# Patient Record
Sex: Female | Born: 2000
Health system: Southern US, Community
[De-identification: ages and names within clinical notes are randomized; demographics above are authoritative.]

## PROBLEM LIST (undated history)

## (undated) ENCOUNTER — Inpatient Hospital Stay (HOSPITAL_COMMUNITY): Payer: Commercial Managed Care - PPO

## (undated) DIAGNOSIS — F32A Depression, unspecified: Secondary | ICD-10-CM

## (undated) DIAGNOSIS — I7300 Raynaud's syndrome without gangrene: Secondary | ICD-10-CM

## (undated) DIAGNOSIS — Z87448 Personal history of other diseases of urinary system: Secondary | ICD-10-CM

## (undated) DIAGNOSIS — G43109 Migraine with aura, not intractable, without status migrainosus: Secondary | ICD-10-CM

## (undated) DIAGNOSIS — F323 Major depressive disorder, single episode, severe with psychotic features: Secondary | ICD-10-CM

## (undated) DIAGNOSIS — S83002S Unspecified subluxation of left patella, sequela: Secondary | ICD-10-CM

## (undated) DIAGNOSIS — K121 Other forms of stomatitis: Secondary | ICD-10-CM

## (undated) DIAGNOSIS — M7918 Myalgia, other site: Secondary | ICD-10-CM

## (undated) DIAGNOSIS — F329 Major depressive disorder, single episode, unspecified: Secondary | ICD-10-CM

## (undated) DIAGNOSIS — F840 Autistic disorder: Secondary | ICD-10-CM

## (undated) DIAGNOSIS — M352 Behcet's disease: Secondary | ICD-10-CM

## (undated) DIAGNOSIS — M2550 Pain in unspecified joint: Secondary | ICD-10-CM

## (undated) DIAGNOSIS — F3181 Bipolar II disorder: Secondary | ICD-10-CM

## (undated) DIAGNOSIS — D649 Anemia, unspecified: Secondary | ICD-10-CM

## (undated) DIAGNOSIS — N946 Dysmenorrhea, unspecified: Secondary | ICD-10-CM

## (undated) DIAGNOSIS — M009 Pyogenic arthritis, unspecified: Secondary | ICD-10-CM

## (undated) DIAGNOSIS — Z8639 Personal history of other endocrine, nutritional and metabolic disease: Secondary | ICD-10-CM

## (undated) DIAGNOSIS — G894 Chronic pain syndrome: Secondary | ICD-10-CM

## (undated) DIAGNOSIS — F419 Anxiety disorder, unspecified: Secondary | ICD-10-CM

## (undated) DIAGNOSIS — J45909 Unspecified asthma, uncomplicated: Secondary | ICD-10-CM

## (undated) DIAGNOSIS — L309 Dermatitis, unspecified: Secondary | ICD-10-CM

## (undated) HISTORY — DX: Autistic disorder: F84.0

## (undated) HISTORY — DX: Dysmenorrhea, unspecified: N94.6

## (undated) HISTORY — DX: Migraine with aura, not intractable, without status migrainosus: G43.109

## (undated) HISTORY — DX: Major depressive disorder, single episode, unspecified: F32.9

## (undated) HISTORY — DX: Depression, unspecified: F32.A

## (undated) HISTORY — DX: Unspecified asthma, uncomplicated: J45.909

## (undated) HISTORY — DX: Personal history of other endocrine, nutritional and metabolic disease: Z86.39

## (undated) HISTORY — DX: Raynaud's syndrome without gangrene: I73.00

## (undated) HISTORY — DX: Dermatitis, unspecified: L30.9

## (undated) HISTORY — DX: Unspecified subluxation of left patella, sequela: S83.002S

## (undated) HISTORY — DX: Anemia, unspecified: D64.9

## (undated) HISTORY — DX: Myalgia, other site: M79.18

## (undated) HISTORY — DX: Pain in unspecified joint: M25.50

## (undated) HISTORY — DX: Bipolar II disorder: F31.81

## (undated) HISTORY — DX: Anxiety disorder, unspecified: F41.9

## (undated) HISTORY — DX: Behcet's disease: M35.2

## (undated) HISTORY — DX: Personal history of other diseases of urinary system: Z87.448

## (undated) HISTORY — DX: Other forms of stomatitis: K12.1

## (undated) HISTORY — DX: Pyogenic arthritis, unspecified: M00.9

## (undated) HISTORY — DX: Major depressive disorder, single episode, severe with psychotic features: F32.3

## (undated) HISTORY — DX: Chronic pain syndrome: G89.4

---
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
## 2020-01-10 ENCOUNTER — Encounter: Payer: Self-pay | Admitting: Physician Assistant

## 2020-01-10 ENCOUNTER — Other Ambulatory Visit: Payer: Self-pay | Admitting: Physician Assistant

## 2020-01-10 DIAGNOSIS — F411 Generalized anxiety disorder: Secondary | ICD-10-CM

## 2020-01-10 MED ORDER — HYDROXYZINE HCL 10 MG PO TABS: ORAL_TABLET | ORAL | 0 refills | Status: DC

## 2020-01-14 ENCOUNTER — Other Ambulatory Visit: Payer: Self-pay | Admitting: Obstetrics and Gynecology

## 2020-01-14 DIAGNOSIS — R102 Pelvic and perineal pain: Secondary | ICD-10-CM

## 2020-01-14 NOTE — Telephone Encounter (Signed)
Medication refill request: Ibuprofen 800mg   Last OV:  08/30/19  Next AEX: not scheduled  Last MMG (if hormonal medication request): NA Refill authorized: 30/0

## 2020-01-16 ENCOUNTER — Telehealth (INDEPENDENT_AMBULATORY_CARE_PROVIDER_SITE_OTHER): Payer: Managed Care, Other (non HMO) | Admitting: Family Medicine

## 2020-01-16 DIAGNOSIS — H9201 Otalgia, right ear: Secondary | ICD-10-CM

## 2020-01-16 DIAGNOSIS — R0981 Nasal congestion: Secondary | ICD-10-CM

## 2020-01-16 DIAGNOSIS — R519 Headache, unspecified: Secondary | ICD-10-CM | POA: Diagnosis not present

## 2020-01-16 DIAGNOSIS — H9191 Unspecified hearing loss, right ear: Secondary | ICD-10-CM

## 2020-01-16 MED ORDER — AMOXICILLIN-POT CLAVULANATE 875-125 MG PO TABS: 1.0000 | ORAL_TABLET | Freq: Two times a day (BID) | ORAL | 0 refills | Status: DC

## 2020-01-16 NOTE — Progress Notes (Signed)
Virtual Visit via Video Note  I connected with Molly Molina  on 01/16/20 at  1:00 PM EDT by a video enabled telemedicine application and verified that I am speaking with the correct person using two identifiers.  Location patient: home, [place] Location provider:work or home office Persons participating in the virtual visit: patient, provider  I discussed the limitations of evaluation and management by telemedicine and the availability of in person appointments. The patient expressed understanding and agreed to proceed.   HPI:  Acute telemedicine visit for ear discomfort and upper respiratory symptoms: -Onset: had a resp illness with nasal cong, cough that started about two weeks ago - got a little better with allergy medications -however, worse the last few days -Symptoms include: today has pain and warm feeling in the R ear, she put a qtip in it and it was "bloddy orange" with "pus or something". she also has pain in the R side of the throat, hearing feels muffled on the R side.  She has sinus congestion and sinus discomfort. -Denies: fevers, sob, worst headache, persistent drainage from the ear -Has tried: benadryl, then zyrtec, robitussin honey -Pertinent past medical history: treated for ear infection in august, treated with abx for abscess in sept, Bechet's, hx of strep, recurrent ear in fections -Pertinent medication allergies: cephalosporins, reports tolerated amox well -COVID-19 vaccine status: Fully vaccinated   ROS: See pertinent positives and negatives per HPI.  Past Medical History:  Diagnosis Date  . Amplified musculoskeletal pain   . Anemia   . Anxiety   . Arthralgia    Fever, arthralgias,rash,abd pain, darrhiea, oral ulcers, and fatigue.  . Asthma   . Asthma   . Autism   . Behcet's syndrome (HCC) 2016   Possible  . Bipolar 2 disorder (HCC)    per patient diagnosis found out to not be bipolar  . Depression   . Dysmenorrhea in adolescent   . Eczema   . History of  proteinuria syndrome 2012   + hx of hematuria: nephrol w/u in [place] and [place] both normal.  . History of vitamin D deficiency   . Major depressive disorder, single episode, severe with psychotic features (HCC) 12/22/2015  . Migraine with aura   . Mouth ulcers    some vaginal also  . Patellar subluxation, left, sequela 08/08/2016  . Raynaud's phenomenon   . Septic arthritis of hip (HCC) 2010    Past Surgical History:  Procedure Laterality Date  . APPENDECTOMY  2010  . MRI L/S spine  2013   Possible bilateral pars defect at L5 without evidence of spondylolisthesis.  Otherwise normal.     Current Outpatient Medications:  .  amoxicillin-clavulanate (AUGMENTIN) 875-125 MG tablet, Take 1 tablet by mouth 2 (two) times daily., Disp: 20 tablet, Rfl: 0 .  colchicine 0.6 MG tablet, Take 0.6 mg by mouth 2 (two) times daily. , Disp: , Rfl:  .  doxycycline (VIBRA-TABS) 100 MG tablet, Take 1 tablet (100 mg total) by mouth 2 (two) times daily., Disp: 20 tablet, Rfl: 0 .  hydrOXYzine (ATARAX/VISTARIL) 10 MG tablet, TAKE 1 TABLET BY MOUTH UP TO THREE TIMES DAILY. MAY TAKE AN ADDITIONAL 2 TABLETS IN THE EVENING, Disp: 150 tablet, Rfl: 0 .  ibuprofen (ADVIL) 800 MG tablet, TAKE 1 TABLET(800 MG) BY MOUTH EVERY 8 HOURS AS NEEDED, Disp: 30 tablet, Rfl: 1 .  ketotifen (ZADITOR) 0.025 % ophthalmic solution, Place 1 drop into both eyes 2 (two) times daily as needed (for itchy eyes). , Disp: ,  Rfl:   EXAM:  VITALS per patient if applicable:  GENERAL: alert, oriented, appears well and in no acute distress  HEENT: atraumatic, conjunttiva clear, no obvious abnormalities on inspection of external nose and ears  NECK: normal movements of the head and neck  LUNGS: on inspection no signs of respiratory distress, breathing rate appears normal, no obvious gross SOB, gasping or wheezing  CV: no obvious cyanosis  MS: moves all visible extremities without noticeable abnormality  PSYCH/NEURO: pleasant and  cooperative, no obvious depression or anxiety, speech and thought processing grossly intact  ASSESSMENT AND PLAN:  Discussed the following assessment and plan:  Discomfort of right ear  Nasal sinus congestion  Facial discomfort  Hearing loss of right ear, unspecified hearing loss type  -we discussed possible serious and likely etiologies, options for evaluation and workup, limitations of telemedicine visit vs in person visit, treatment, treatment risks and precautions. Pt prefers to treat via telemedicine empirically rather than in person at this moment.  However, given that she saw the blood and pus on the Q-tip and the recurrent ear issues, I did recommend an evaluation in person.  Things are complicated, due to the fact that she is leaving for [place] in 2 days.  She opted to start empiric treatment with Augmentin 875 twice daily for 10 days, but she also agrees to be seen in person either with an ear nose and throat specialist or at urgent care prior to travel.  She plans to call around to several ear nose and throat offices after this visit, to see who can get her in faster.  It would be good for her to be plugged in with an ear nose and throat specialist at some point, given she reports recurrent ear infections and throat infections as an adult.  If she cannot be seen by ear nose and throat, she plans to seek care at a local urgent care near where she lives in the next 1 to 2 days.  Scheduled follow up with PCP offered: Declined    Did let this patient know that I only do telemedicine on Tuesdays and Thursdays for [place]. Advised to schedule follow up visit with PCP or UCC if any further questions or concerns to avoid delays in care.   I discussed the assessment and treatment plan with the patient. The patient was provided an opportunity to ask questions and all were answered. The patient agreed with the plan and demonstrated an understanding of the instructions.     Terressa Koyanagi, DO

## 2020-01-17 ENCOUNTER — Ambulatory Visit (INDEPENDENT_AMBULATORY_CARE_PROVIDER_SITE_OTHER): Payer: Managed Care, Other (non HMO) | Admitting: Family Medicine

## 2020-01-17 ENCOUNTER — Other Ambulatory Visit: Payer: Self-pay

## 2020-01-17 VITALS — BP 102/66 | HR 78 | Temp 98.2°F | Ht 62.75 in | Wt 207.0 lb

## 2020-01-17 DIAGNOSIS — M352 Behcet's disease: Secondary | ICD-10-CM

## 2020-01-17 DIAGNOSIS — H6690 Otitis media, unspecified, unspecified ear: Secondary | ICD-10-CM

## 2020-01-17 MED ORDER — CIPROFLOXACIN-DEXAMETHASONE 0.3-0.1 % OT SUSP: 4.0000 [drp] | Freq: Two times a day (BID) | OTIC | 0 refills | Status: DC

## 2020-01-17 NOTE — Assessment & Plan Note (Signed)
Will check CBC and CMET today per rheumatology.

## 2020-01-17 NOTE — Progress Notes (Signed)
   Molly Molina is a 19 y.o. female who presents today for an office visit.  Assessment/Plan:  New/Acute Problems: Right Ear Pain Slight otitis externa.  Does have a middle ear effusion but no definitive signs of otitis media.  We will start Ciprodex drops.  Advised her to hold onto Augmentin prescription and start if symptoms worsen or do not improve with Ciprodex drops over the next few days.  Given her recurrent pharyngitis and ear infections will place referral to ENT.  Chronic Problems Addressed Today: Behcet's syndrome (HCC) Will check CBC and CMET today per rheumatology.      Subjective:  HPI:  Patient had virtual visit by another provider yesterday for right ear pain.  She was prescribed Augmentin but has not yet started.  Symptoms started 4 days ago. Worsening the last few days. She used a qtip and found a blood discharge. Also some pain to the right side of her neck.   This is her third ear infection this year. She also has a history of recurrent strep pharyngitis.        Objective:  Physical Exam: BP 102/66   Pulse 78   Temp 98.2 F (36.8 C) (Temporal)   Ht 5' 2.75" (1.594 m)   Wt 207 lb (93.9 kg)   LMP 01/09/2020   SpO2 99%   BMI 36.96 kg/m   Gen: No acute distress, resting comfortably HEENT: LEft TM Clear.  Right TM with erythema and slight bulge.  Right EAC with erythema and inflammation. CV: Regular rate and rhythm with no murmurs appreciated Pulm: Normal work of breathing, clear to auscultation bilaterally with no crackles, wheezes, or rhonchi Neuro: Grossly normal, moves all extremities Psych: Normal affect and thought content  Time Spent: 35 minutes of total time was spent on the date of the encounter performing the following actions: chart review prior to seeing the patient, obtaining history, performing a medically necessary exam, counseling on the treatment plan including starting Ciprodex drops and holding onto Augmentin, placing orders, and  documenting in our EHR.         Katina Degree. Jimmey Ralph, MD 01/17/2020 4:27 PM

## 2020-01-17 NOTE — Patient Instructions (Addendum)
It was very nice to see you today!  Please try the ear drops. Start the antibiotic if your symptoms are not improving.   I will check the blood work your rheumatologist needs today.  I will place a referral for you to see ENT.   Take care, Dr Jimmey Ralph  Please try these tips to maintain a healthy lifestyle:   Eat at least 3 REAL meals and 1-2 snacks per day.  Aim for no more than 5 hours between eating.  If you eat breakfast, please do so within one hour of getting up.    Each meal should contain half fruits/vegetables, one quarter protein, and one quarter carbs (no bigger than a computer mouse)   Cut down on sweet beverages. This includes juice, soda, and sweet tea.     Drink at least 1 glass of water with each meal and aim for at least 8 glasses per day   Exercise at least 150 minutes every week.

## 2020-01-18 LAB — CBC WITH DIFFERENTIAL/PLATELET
Absolute Monocytes: 524 cells/uL (ref 200–950)
Basophils Absolute: 68 cells/uL (ref 0–200)
Basophils Relative: 1 %
Eosinophils Absolute: 401 cells/uL (ref 15–500)
Eosinophils Relative: 5.9 %
HCT: 39.4 % (ref 35.0–45.0)
Hemoglobin: 12.9 g/dL (ref 11.7–15.5)
Lymphs Abs: 1836 cells/uL (ref 850–3900)
MCH: 26.3 pg — ABNORMAL LOW (ref 27.0–33.0)
MCHC: 32.7 g/dL (ref 32.0–36.0)
MCV: 80.4 fL (ref 80.0–100.0)
MPV: 11 fL (ref 7.5–12.5)
Monocytes Relative: 7.7 %
Neutro Abs: 3971 cells/uL (ref 1500–7800)
Neutrophils Relative %: 58.4 %
Platelets: 330 10*3/uL (ref 140–400)
RBC: 4.9 10*6/uL (ref 3.80–5.10)
RDW: 13.3 % (ref 11.0–15.0)
Total Lymphocyte: 27 %
WBC: 6.8 10*3/uL (ref 3.8–10.8)

## 2020-01-18 LAB — COMPREHENSIVE METABOLIC PANEL
AG Ratio: 1.6 (calc) (ref 1.0–2.5)
ALT: 10 U/L (ref 5–32)
AST: 13 U/L (ref 12–32)
Albumin: 4.5 g/dL (ref 3.6–5.1)
Alkaline phosphatase (APISO): 51 U/L (ref 36–128)
BUN: 14 mg/dL (ref 7–20)
CO2: 27 mmol/L (ref 20–32)
Calcium: 9.7 mg/dL (ref 8.9–10.4)
Chloride: 104 mmol/L (ref 98–110)
Creat: 0.81 mg/dL (ref 0.50–1.00)
Globulin: 2.9 g/dL (calc) (ref 2.0–3.8)
Glucose, Bld: 73 mg/dL (ref 65–99)
Potassium: 4.1 mmol/L (ref 3.8–5.1)
Sodium: 140 mmol/L (ref 135–146)
Total Bilirubin: 0.4 mg/dL (ref 0.2–1.1)
Total Protein: 7.4 g/dL (ref 6.3–8.2)

## 2020-01-20 NOTE — Progress Notes (Signed)
Please inform patient of the following:  Labs are NORMAL - she should be able to see this through mychart to send to her rheumatologist.  Molly Molina. Jimmey Ralph, MD 01/20/2020 8:12 AM

## 2020-01-22 IMAGING — CT CT CHEST W/O CM
1 of 2 series · 15 of 32 positions shown, 19 images · non-contrast
Comparison: None.

CLINICAL DATA: 16-year-old female with chest pain and shortness of
breath with activity. Kassa disease.

EXAM:
CT CHEST WITHOUT CONTRAST
TECHNIQUE: Multidetector CT imaging of the chest was performed following the
standard protocol without IV contrast.

[Series 2: chest w/(date) · axial · 0.69mm/px · z∈[-223,+7]mm · 15 of 127 slices shown, 19 images]
[im 6/127  mediastinal]
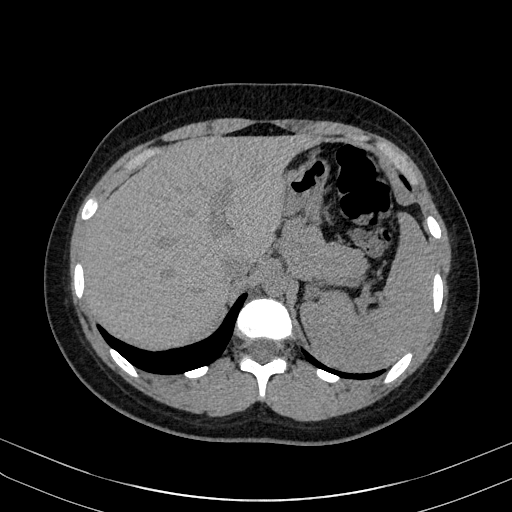
[im 6/127  lung]
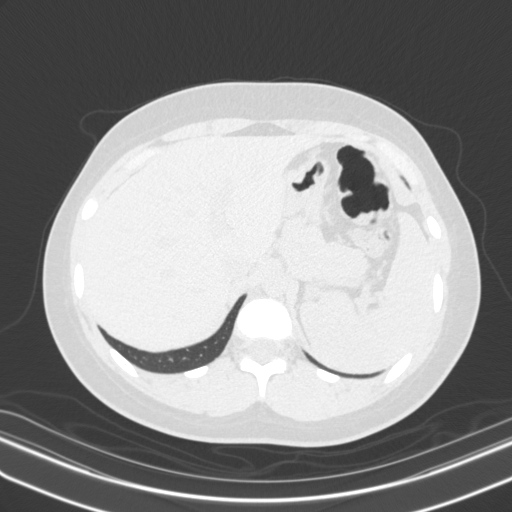
[im 17/127  lung]
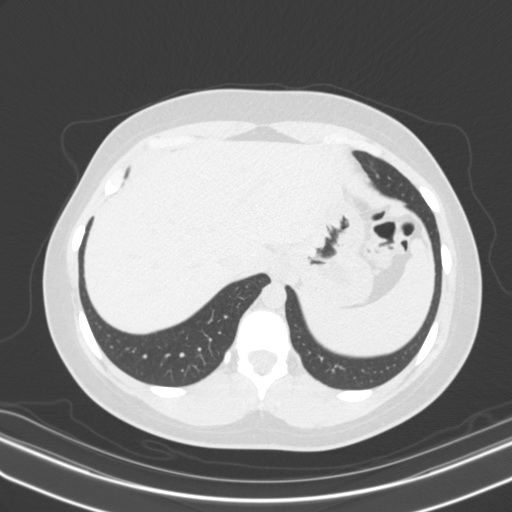
[im 28/127  lung]
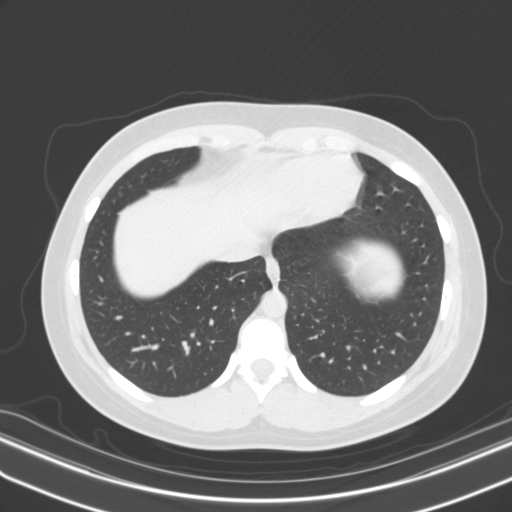
[im 33/127  lung]
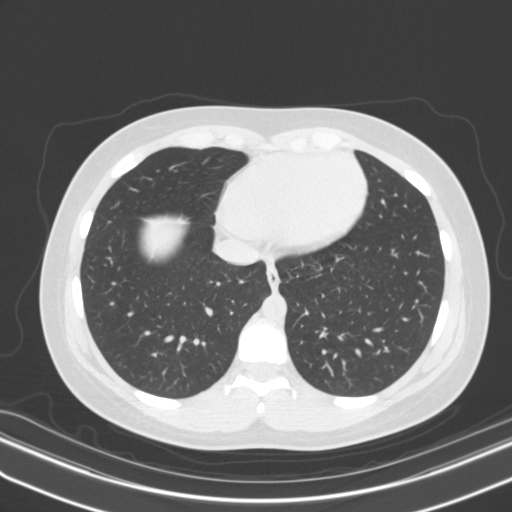
[im 39/127  mediastinal]
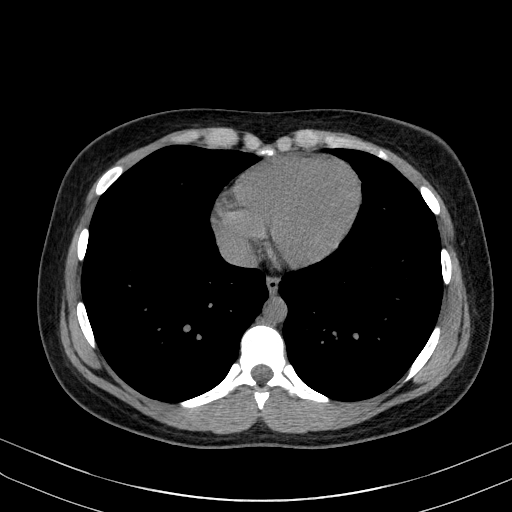
[im 39/127  lung]
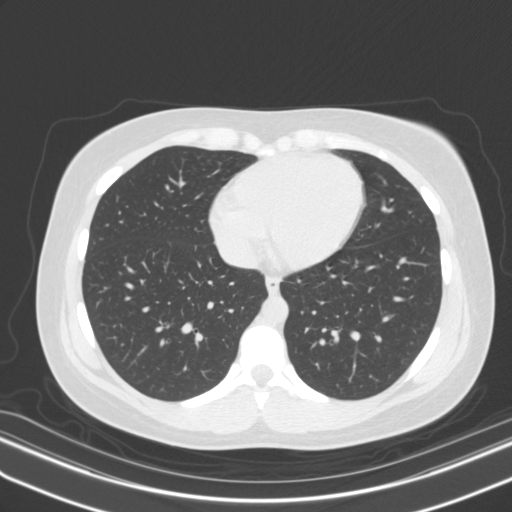
[im 50/127  lung]
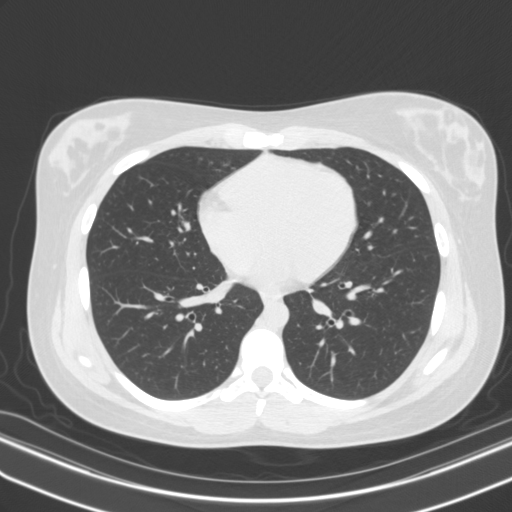
[im 55/127  lung]
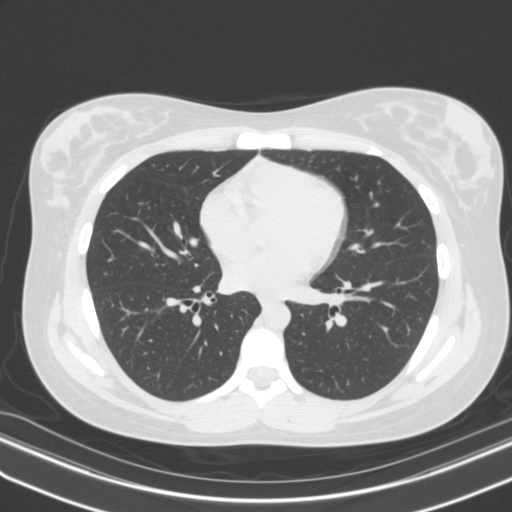
[im 64/127  lung]
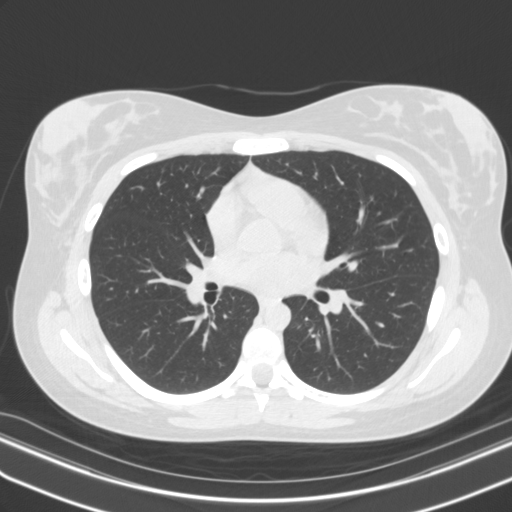
[im 66/127  mediastinal]
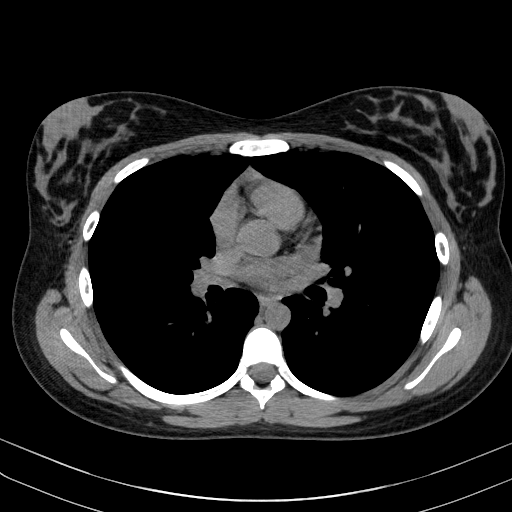
[im 66/127  lung]
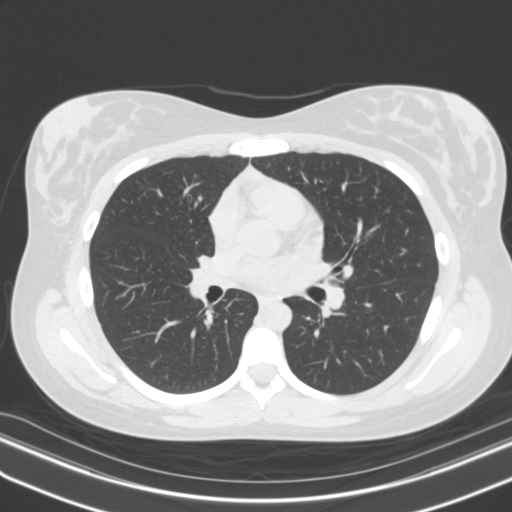
[im 77/127  lung]
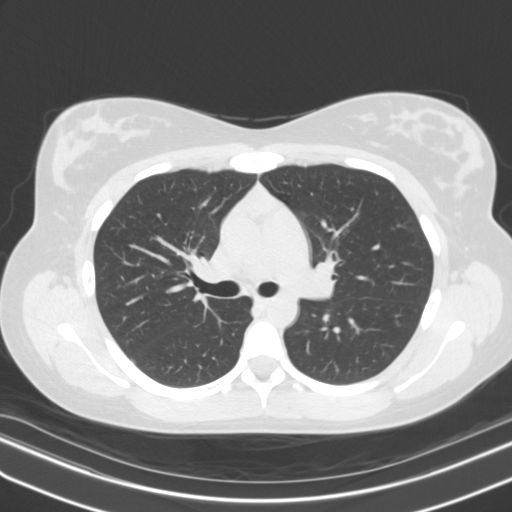
[im 88/127  lung]
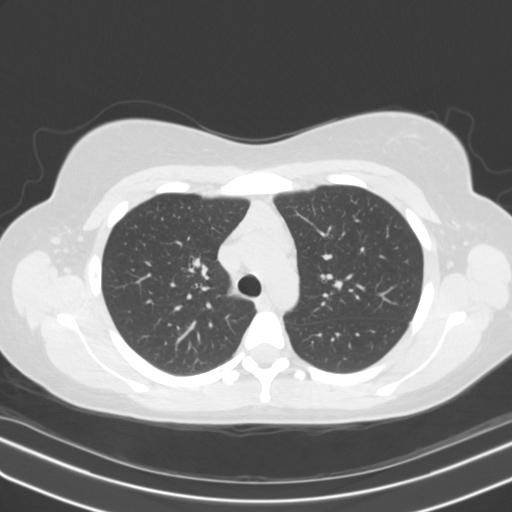
[im 94/127  lung]
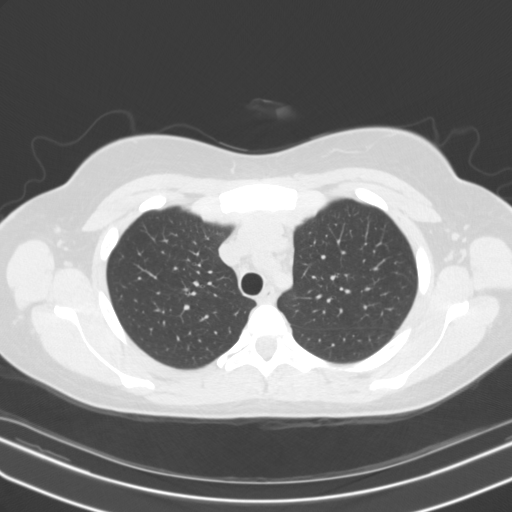
[im 99/127  mediastinal]
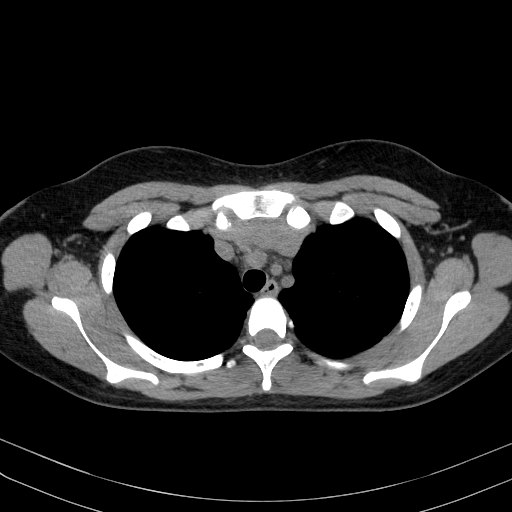
[im 99/127  lung]
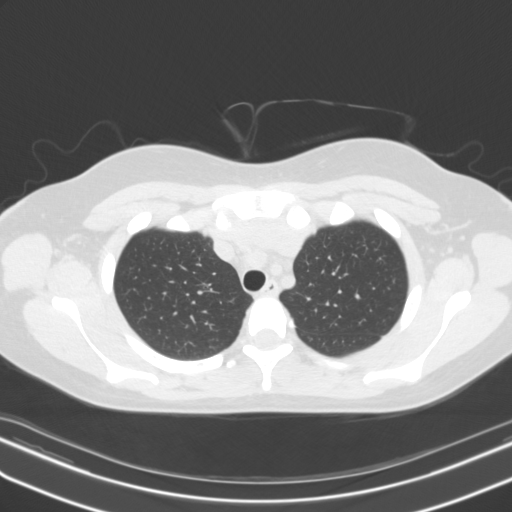
[im 110/127  lung]
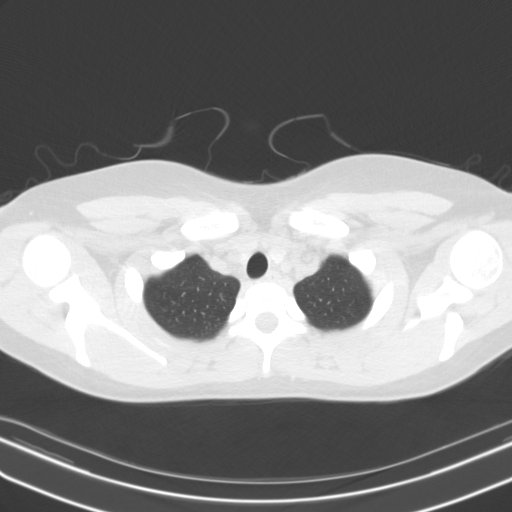
[im 121/127  lung]
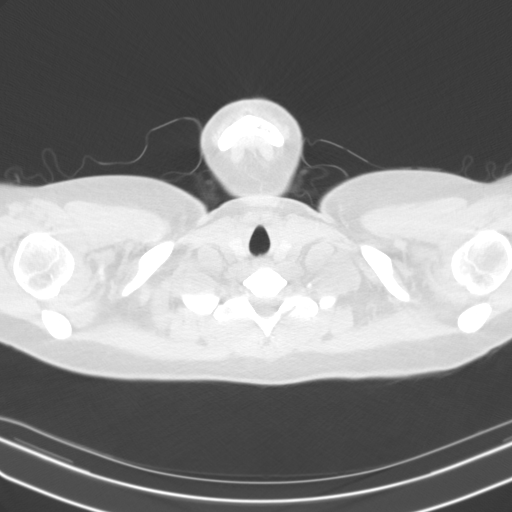

[15 of 32 positions shown; findings below may reference images not displayed]

FINDINGS: Cardiovascular: No pericardial effusion. No cardiomegaly. Vascular
patency is not evaluated in the absence of IV contrast. Grossly
normal great mediastinal vessel morphology; no aortic or pulmonary
artery enlargement.

Mediastinum/Nodes: Physiologic thymus. Normal mediastinum, no
lymphadenopathy. Negative noncontrast thoracic inlet.

Lungs/Pleura: The major airways are patent and normal. Normal lung
volumes. Both lungs are clear. No pleural effusion.

Upper Abdomen: Visible noncontrast liver, spleen, pancreas, adrenal
glands, kidneys, and bowel in the upper abdomen appear normal.

Musculoskeletal: Negative.
IMPRESSION: Normal noncontrast CT appearance of the chest. No pulmonary
abnormality.

## 2020-03-06 ENCOUNTER — Encounter: Payer: Self-pay | Admitting: Physician Assistant

## 2020-03-16 ENCOUNTER — Ambulatory Visit: Payer: Managed Care, Other (non HMO) | Admitting: Physician Assistant

## 2020-03-18 ENCOUNTER — Ambulatory Visit (INDEPENDENT_AMBULATORY_CARE_PROVIDER_SITE_OTHER): Payer: Managed Care, Other (non HMO) | Admitting: Physician Assistant

## 2020-03-18 ENCOUNTER — Other Ambulatory Visit: Payer: Self-pay

## 2020-03-18 ENCOUNTER — Encounter: Payer: Self-pay | Admitting: Physician Assistant

## 2020-03-18 VITALS — BP 100/66 | HR 86 | Temp 97.8°F | Ht 62.75 in | Wt 212.5 lb

## 2020-03-18 DIAGNOSIS — L989 Disorder of the skin and subcutaneous tissue, unspecified: Secondary | ICD-10-CM

## 2020-03-18 NOTE — Patient Instructions (Addendum)
It was great to see you!  Call the dermatologist  [facility]:   [address] [place], [place] [address] Phone: [phone]  Let me know if you have trouble getting through for an appointment.  Take care,  Jarold Motto PA-C

## 2020-03-18 NOTE — Progress Notes (Signed)
Molly Molina is a 19 y.o. female here for a follow up of a pre-existing problem.  I acted as a Neurosurgeon for [employer], PA-C Corky Mull, LPN   History of Present Illness:   Chief Complaint  Patient presents with  . Abscess    HPI   Abscess Pt c/o abscess on left buttock has been draining off and on since Nov.  Pt has been cleaning and applying bacitracin to area. She has recurrent lesions on her buttocks and wonders why this keeps occurring. Denies: fevers, chills, malaise, nausea. She is worried that these areas are not completely healing appropriately.  Past Medical History:  Diagnosis Date  . Amplified musculoskeletal pain   . Anemia   . Anxiety   . Arthralgia    Fever, arthralgias,rash,abd pain, darrhiea, oral ulcers, and fatigue.  . Asthma   . Asthma   . Autism   . Behcet's syndrome (HCC) 2016   Possible  . Bipolar 2 disorder (HCC)    per patient diagnosis found out to not be bipolar  . Depression   . Dysmenorrhea in adolescent   . Eczema   . History of proteinuria syndrome 2012   + hx of hematuria: nephrol w/u in [place] and [place] both normal.  . History of vitamin D deficiency   . Major depressive disorder, single episode, severe with psychotic features (HCC) 12/22/2015  . Migraine with aura   . Mouth ulcers    some vaginal also  . Patellar subluxation, left, sequela 08/08/2016  . Raynaud's phenomenon   . Septic arthritis of hip (HCC) 2010     Social History   Tobacco Use  . Smoking status: Never Smoker  . Smokeless tobacco: Never Used  Vaping Use  . Vaping Use: Never used  Substance Use Topics  . Alcohol use: No    Alcohol/week: 0.0 standard drinks  . Drug use: No    Past Surgical History:  Procedure Laterality Date  . APPENDECTOMY  2010  . MRI L/S spine  2013   Possible bilateral pars defect at L5 without evidence of spondylolisthesis.  Otherwise normal.    Family History  Problem Relation Age of Onset  . Cancer Mother   .  Cervical cancer Mother   . Endometriosis Mother   . Bipolar disorder Mother   . Anxiety disorder Mother   . Anxiety disorder Father   . Anxiety disorder Maternal Aunt   . Depression Maternal Aunt   . Thyroid disease Maternal Grandmother   . Bipolar disorder Maternal Grandmother   . Depression Maternal Grandmother   . Prostate cancer Maternal Grandfather   . Diabetes Maternal Grandfather   . Heart disease Maternal Grandfather   . Heart disease Paternal Grandmother   . Rheumatic fever Paternal Grandmother   . Stroke Paternal Grandmother   . Depression Paternal Grandmother   . Mental illness Paternal Grandmother        either bipolar or schizophrenia  . Suicidality Paternal Grandmother        attempted  . Anxiety disorder Cousin   . Depression Cousin   . Suicidality Cousin   . Suicidality Other   . Suicidality Other     Allergies  Allergen Reactions  . Mushroom Extract Complex Hives and Rash  . Cephalosporins Hives and Rash    Current Medications:   Current Outpatient Medications:  .  colchicine 0.6 MG tablet, Take 0.6 mg by mouth 2 (two) times daily. , Disp: , Rfl:  .  hydrOXYzine (ATARAX/VISTARIL) 10  MG tablet, TAKE 1 TABLET BY MOUTH UP TO THREE TIMES DAILY. MAY TAKE AN ADDITIONAL 2 TABLETS IN THE EVENING, Disp: 150 tablet, Rfl: 0 .  ibuprofen (ADVIL) 800 MG tablet, TAKE 1 TABLET(800 MG) BY MOUTH EVERY 8 HOURS AS NEEDED, Disp: 30 tablet, Rfl: 1 .  ketotifen (ZADITOR) 0.025 % ophthalmic solution, Place 1 drop into both eyes 2 (two) times daily as needed (for itchy eyes). , Disp: , Rfl:    Review of Systems:   ROS Negative unless otherwise specified per HPI.  Vitals:   Vitals:   03/18/20 1543  BP: 100/66  Pulse: 86  Temp: 97.8 F (36.6 C)  TempSrc: Temporal  SpO2: 100%  Weight: 212 lb 8 oz (96.4 kg)  Height: 5' 2.75" (1.594 m)     Body mass index is 37.94 kg/m.  Physical Exam:   Physical Exam Constitutional:      Appearance: She is well-developed and  well-nourished.  HENT:     Head: Normocephalic and atraumatic.  Eyes:     Extraocular Movements: EOM normal.     Conjunctiva/sclera: Conjunctivae normal.  Pulmonary:     Effort: Pulmonary effort is normal.  Musculoskeletal:        General: Normal range of motion.     Cervical back: Normal range of motion and neck supple.  Skin:    General: Skin is warm and dry.     Comments: Buttocks with significant scarring from past lesions One 1/2 cm fluctuant raised lesion without TTP on L buttock  Neurological:     Mental Status: She is alert and oriented to person, place, and time.  Psychiatric:        Mood and Affect: Mood and affect normal.        Behavior: Behavior normal.        Thought Content: Thought content normal.        Judgment: Judgment normal.       Assessment and Plan:   Molly Molina was seen today for abscess.  Diagnoses and all orders for this visit:  Skin lesions   No red flags on exam. Offered I&D of early lesion but she declined. Will have her contact her dermatologist (I provided the number) to see if they could get her scheduled so she can have further evaluation of these recurrent lesions. Worsening precautions advised.   CMA or LPN served as scribe during this visit. History, Physical, and Plan performed by medical provider. The above documentation has been reviewed and is accurate and complete.  Jarold Motto, PA-C

## 2020-03-31 ENCOUNTER — Ambulatory Visit: Payer: Managed Care, Other (non HMO) | Admitting: Physician Assistant

## 2020-04-14 ENCOUNTER — Other Ambulatory Visit: Payer: Managed Care, Other (non HMO)

## 2020-04-14 DIAGNOSIS — Z20822 Contact with and (suspected) exposure to covid-19: Secondary | ICD-10-CM

## 2020-04-16 ENCOUNTER — Ambulatory Visit: Payer: Managed Care, Other (non HMO) | Admitting: Physician Assistant

## 2020-04-18 LAB — NOVEL CORONAVIRUS, NAA: SARS-CoV-2, NAA: NOT DETECTED

## 2020-04-29 ENCOUNTER — Ambulatory Visit: Payer: Managed Care, Other (non HMO) | Admitting: Physician Assistant

## 2020-04-29 ENCOUNTER — Encounter: Payer: Self-pay | Admitting: Physician Assistant

## 2020-04-29 ENCOUNTER — Other Ambulatory Visit: Payer: Self-pay

## 2020-04-29 VITALS — BP 110/62 | HR 72 | Temp 97.7°F | Ht 62.75 in | Wt 217.4 lb

## 2020-04-29 DIAGNOSIS — L0291 Cutaneous abscess, unspecified: Secondary | ICD-10-CM

## 2020-04-29 MED ORDER — DOXYCYCLINE HYCLATE 100 MG PO TABS: 100.0000 mg | ORAL_TABLET | Freq: Two times a day (BID) | ORAL | 0 refills | Status: DC

## 2020-04-29 NOTE — Patient Instructions (Signed)
It was great to see you!  Please continue to plan to follow-up with dermatology.  Start oral doxycycline.  Abscess care 1. If you have an abscess that has not drained, apply heat to the affected area. Use the heat source that your health care provider recommends, such as a moist heat pack or a heating pad. ? Place a towel between your skin and the heat source. ? Leave the heat on for 20-30 minutes. ? Remove the heat if your skin turns bright red. This is especially important if you are unable to feel pain, heat, or cold. You may have a greater risk of getting burned. 2. Follow instructions from your health care provider about how to take care of your abscess. Make sure you: ? Cover the abscess with a bandage (dressing). ? Change your dressing or gauze as told by your health care provider. ? Wash your hands with soap and water before you change the dressing or gauze. If soap and water are not available, use hand sanitizer. 3. Check your abscess every day for signs of a worsening infection. Check for: ? More redness, swelling, or pain. ? More fluid or blood. ? Warmth. ? More pus or a bad smell.

## 2020-04-29 NOTE — Progress Notes (Signed)
Molly Molina is a 20 y.o. female here for a new problem.  I acted as a Neurosurgeon for [employer], PA-C [employer], LPN   History of Present Illness:   Chief Complaint  Patient presents with  . cysts    HPI    Early abscess Pt c/o 2 cysts on the R side of her bikini line area. She noticed one yesterday. She has applied alcohol to them and bacitracin. She denies: fevers, chills, n/v, fatigue. Area has not spontaneously opened. She has hx of recurrent skin infections and pending dermatology appointment.   Past Medical History:  Diagnosis Date  . Amplified musculoskeletal pain   . Anemia   . Anxiety   . Arthralgia    Fever, arthralgias,rash,abd pain, darrhiea, oral ulcers, and fatigue.  . Asthma   . Asthma   . Autism   . Behcet's syndrome (HCC) 2016   Possible  . Bipolar 2 disorder (HCC)    per patient diagnosis found out to not be bipolar  . Depression   . Dysmenorrhea in adolescent   . Eczema   . History of proteinuria syndrome 2012   + hx of hematuria: nephrol w/u in [place] and [place] both normal.  . History of vitamin D deficiency   . Major depressive disorder, single episode, severe with psychotic features (HCC) 12/22/2015  . Migraine with aura   . Mouth ulcers    some vaginal also  . Patellar subluxation, left, sequela 08/08/2016  . Raynaud's phenomenon   . Septic arthritis of hip (HCC) 2010     Social History   Tobacco Use  . Smoking status: Never Smoker  . Smokeless tobacco: Never Used  Vaping Use  . Vaping Use: Never used  Substance Use Topics  . Alcohol use: No    Alcohol/week: 0.0 standard drinks  . Drug use: No    Past Surgical History:  Procedure Laterality Date  . APPENDECTOMY  2010  . MRI L/S spine  2013   Possible bilateral pars defect at L5 without evidence of spondylolisthesis.  Otherwise normal.    Family History  Problem Relation Age of Onset  . Cancer Mother   . Cervical cancer Mother   . Endometriosis Mother   .  Bipolar disorder Mother   . Anxiety disorder Mother   . Anxiety disorder Father   . Anxiety disorder Maternal Aunt   . Depression Maternal Aunt   . Thyroid disease Maternal Grandmother   . Bipolar disorder Maternal Grandmother   . Depression Maternal Grandmother   . Prostate cancer Maternal Grandfather   . Diabetes Maternal Grandfather   . Heart disease Maternal Grandfather   . Heart disease Paternal Grandmother   . Rheumatic fever Paternal Grandmother   . Stroke Paternal Grandmother   . Depression Paternal Grandmother   . Mental illness Paternal Grandmother        either bipolar or schizophrenia  . Suicidality Paternal Grandmother        attempted  . Anxiety disorder Cousin   . Depression Cousin   . Suicidality Cousin   . Suicidality Other   . Suicidality Other     Allergies  Allergen Reactions  . Mushroom Extract Complex Hives and Rash  . Cephalosporins Hives and Rash    Current Medications:   Current Outpatient Medications:  .  colchicine 0.6 MG tablet, Take 0.6 mg by mouth 2 (two) times daily. , Disp: , Rfl:  .  doxycycline (VIBRA-TABS) 100 MG tablet, Take 1 tablet (100 mg  total) by mouth 2 (two) times daily., Disp: 20 tablet, Rfl: 0 .  hydrOXYzine (ATARAX/VISTARIL) 10 MG tablet, TAKE 1 TABLET BY MOUTH UP TO THREE TIMES DAILY. MAY TAKE AN ADDITIONAL 2 TABLETS IN THE EVENING, Disp: 150 tablet, Rfl: 0 .  ibuprofen (ADVIL) 800 MG tablet, TAKE 1 TABLET(800 MG) BY MOUTH EVERY 8 HOURS AS NEEDED, Disp: 30 tablet, Rfl: 1 .  ketotifen (ZADITOR) 0.025 % ophthalmic solution, Place 1 drop into both eyes 2 (two) times daily as needed (for itchy eyes). , Disp: , Rfl:    Review of Systems:   ROS  Negative unless otherwise specified per HPI.  Vitals:   Vitals:   04/29/20 1353  BP: 110/62  Pulse: 72  Temp: 97.7 F (36.5 C)  TempSrc: Temporal  Weight: 217 lb 6.1 oz (98.6 kg)  Height: 5' 2.75" (1.594 m)     Body mass index is 38.81 kg/m.  Physical Exam:   Physical  Exam Constitutional:      Appearance: She is well-developed and well-nourished.  HENT:     Head: Normocephalic and atraumatic.  Eyes:     Extraocular Movements: EOM normal.     Conjunctiva/sclera: Conjunctivae normal.  Pulmonary:     Effort: Pulmonary effort is normal.  Musculoskeletal:        General: Normal range of motion.     Cervical back: Normal range of motion and neck supple.  Skin:    General: Skin is warm and dry.     Comments: Two small areas of induration to R inguinal area, no fluctuance or evidence of discharge. Mild TTP and erythema. No streaking.  Neurological:     Mental Status: She is alert and oriented to person, place, and time.  Psychiatric:        Mood and Affect: Mood and affect normal.        Behavior: Behavior normal.        Thought Content: Thought content normal.        Judgment: Judgment normal.     Assessment and Plan:   Tanee was seen today for cysts.  Diagnoses and all orders for this visit:  Abscess Appearance is consistent with early abscess. This area does not appear ready for any I&D procedure. Discussed starting oral doxycycline and trialing warm compresses. Worsening precautions discussed and given on AVS.  Other orders -     doxycycline (VIBRA-TABS) 100 MG tablet; Take 1 tablet (100 mg total) by mouth 2 (two) times daily.    CMA or LPN served as scribe during this visit. History, Physical, and Plan performed by medical provider. The above documentation has been reviewed and is accurate and complete.  Jarold Motto, PA-C

## 2020-06-10 ENCOUNTER — Encounter: Payer: Self-pay | Admitting: Physician Assistant

## 2020-06-10 ENCOUNTER — Other Ambulatory Visit: Payer: Self-pay

## 2020-06-10 ENCOUNTER — Ambulatory Visit: Payer: Managed Care, Other (non HMO) | Admitting: Physician Assistant

## 2020-06-10 DIAGNOSIS — L732 Hidradenitis suppurativa: Secondary | ICD-10-CM

## 2020-06-10 MED ORDER — DOXYCYCLINE HYCLATE 100 MG PO TABS: 100.0000 mg | ORAL_TABLET | Freq: Two times a day (BID) | ORAL | 4 refills | Status: DC

## 2020-06-10 NOTE — Patient Instructions (Signed)
Hidradenitis Suppurativa Hidradenitis suppurativa is a long-term (chronic) skin disease. It is similar to a severe form of acne, but it affects areas of the body where acne would be unusual, especially areas of the body where skin rubs against skin and becomes moist. These include:  Underarms.  Groin.  Genital area.  Buttocks.  Upper thighs.  Breasts. Hidradenitis suppurativa may start out as small lumps or pimples caused by blocked sweat glands or hair follicles. Pimples may develop into deep sores that break open (rupture) and drain pus. Over time, affected areas of skin may thicken and become scarred. This condition is rare and does not spread from person to person (non-contagious). What are the causes? The exact cause of this condition is not known. It may be related to:  Female and female hormones.  An overactive disease-fighting system (immune system). The immune system may over-react to blocked hair follicles or sweat glands and cause swelling and pus-filled sores. What increases the risk? You are more likely to develop this condition if you:  Are female.  Are 11-55 years old.  Have a family history of hidradenitis suppurativa.  Have a personal history of acne.  Are overweight.  Smoke.  Take the medicine lithium. What are the signs or symptoms? The first symptoms are usually painful bumps in the skin, similar to pimples. The condition may get worse over time (progress), or it may only cause mild symptoms. If the disease progresses, symptoms may include:  Skin bumps getting bigger and growing deeper into the skin.  Bumps rupturing and draining pus.  Itchy, infected skin.  Skin getting thicker and scarred.  Tunnels under the skin (fistulas) where pus drains from a bump.  Pain during daily activities, such as pain during walking if your groin area is affected.  Emotional problems, such as stress or depression. This condition may affect your appearance and your  ability or willingness to wear certain clothes or do certain activities. How is this diagnosed? This condition is diagnosed by a health care provider who specializes in skin diseases (dermatologist). You may be diagnosed based on:  Your symptoms and medical history.  A physical exam.  Testing a pus sample for infection.  Blood tests. How is this treated? Your treatment will depend on how severe your symptoms are. The same treatment will not work for everybody with this condition. You may need to try several treatments to find what works best for you. Treatment may include:  Cleaning and bandaging (dressing) your wounds as needed.  Lifestyle changes, such as new skin care routines.  Taking medicines, such as: ? Antibiotics. ? Acne medicines. ? Medicines to reduce the activity of the immune system. ? A diabetes medicine (metformin). ? Birth control pills, for women. ? Steroids to reduce swelling and pain.  Working with a mental health care provider, if you experience emotional distress due to this condition. If you have severe symptoms that do not get better with medicine, you may need surgery. Surgery may involve:  Using a laser to clear the skin and remove hair follicles.  Opening and draining deep sores.  Removing the areas of skin that are diseased and scarred. Follow these instructions at home: Medicines  Take over-the-counter and prescription medicines only as told by your health care provider.  If you were prescribed an antibiotic medicine, take it as told by your health care provider. Do not stop taking the antibiotic even if your condition improves.   Skin care  If you have open wounds,   cover them with a clean dressing as told by your health care provider. Keep wounds clean by washing them gently with soap and water when you bathe.  Do not shave the areas where you get hidradenitis suppurativa.  Do not wear deodorant.  Wear loose-fitting clothes.  Try to avoid  getting overheated or sweaty. If you get sweaty or wet, change into clean, dry clothes as soon as you can.  To help relieve pain and itchiness, cover sore areas with a warm, clean washcloth (warm compress) for 5-10 minutes as often as needed.  If told by your health care provider, take a bleach bath twice a week: ? Fill your bathtub halfway with water. ? Pour in  cup of unscented household bleach. ? Soak in the tub for 5-10 minutes. ? Only soak from the neck down. Avoid water on your face and hair. ? Shower to rinse off the bleach from your skin. General instructions  Learn as much as you can about your disease so that you have an active role in your treatment. Work closely with your health care provider to find treatments that work for you.  If you are overweight, work with your health care provider to lose weight as recommended.  Do not use any products that contain nicotine or tobacco, such as cigarettes and e-cigarettes. If you need help quitting, ask your health care provider.  If you struggle with living with this condition, talk with your health care provider or work with a mental health care provider as recommended.  Keep all follow-up visits as told by your health care provider. This is important. Where to find more information  Hidradenitis Suppurativa Foundation, Inc.: https://www.hs-foundation.org/  American Academy of Dermatology: https://www.aad.org Contact a health care provider if you have:  A flare-up of hidradenitis suppurativa.  A fever or chills.  Trouble controlling your symptoms at home.  Trouble doing your daily activities because of your symptoms.  Trouble dealing with emotional problems related to your condition. Summary  Hidradenitis suppurativa is a long-term (chronic) skin disease. It is similar to a severe form of acne, but it affects areas of the body where acne would be unusual.  The first symptoms are usually painful bumps in the skin, similar  to pimples. The condition may only cause mild symptoms, or it may get worse over time (progress).  If you have open wounds, cover them with a clean dressing as told by your health care provider. Keep wounds clean by washing them gently with soap and water when you bathe.  Besides skin care, treatment may include medicines, laser treatment, and surgery. This information is not intended to replace advice given to you by your health care provider. Make sure you discuss any questions you have with your health care provider. Document Revised: 01/14/2020 Document Reviewed: 01/14/2020 Elsevier Patient Education  2021 Elsevier Inc.  

## 2020-06-11 ENCOUNTER — Encounter: Payer: Self-pay | Admitting: Physician Assistant

## 2020-06-11 NOTE — Progress Notes (Addendum)
   Follow-Up Visit   Subjective  Molly Molina is a 20 y.o. female who presents for the following: Skin Problem Spots, boil like, that continue to reoccur groin and other areas, some get red and hot, some purple. They become infected per patient she has been on antibiotics and they help a little.  PCP has done previous excision on a lesion which was extremely painful. Currently has two that (per patient)that have drained themselves, leaves scars x year. PCP brought up MRSA. Per patient and her mother a culture was never performed.  Patient has a history of Behcet's with chemotherapeutic treatment.  The following portions of the chart were reviewed this encounter and updated as appropriate:  Tobacco  Allergies  Meds  Problems  Med Hx  Surg Hx  Fam Hx      Objective  Well appearing patient in no apparent distress; mood and affect are within normal limits.  A full examination was performed including scalp, head, eyes, ears, nose, lips, neck, chest, axillae, abdomen, back, buttocks, bilateral upper extremities, bilateral lower extremities, hands, feet, fingers, toes, fingernails, and toenails. All findings within normal limits unless otherwise noted below.  Objective  Left buttock - Posterior, Left inquinal area, Right Inframammary Fold, Right Inguinal Area, Right buttocks - Posterior: Inguinal cysts with erythema and open wound- no purulent exudate at this time. These are both healing. One  tender active red nodule. Buttocks- numerous areas of post inflammatory hyperpigmentation with no active cysts.    Assessment & Plan  Hidradenitis suppurativa (5) Left buttock - Posterior; Right buttocks - Posterior; Left inquinal area; Right Inframammary Fold; Right Inguinal Area  Humira New start when approved by Dr. Unice Cobble - Rheumatology [facility].   Anaerobic and Aerobic Culture - Left buttock - Posterior, Left inquinal area, Right Inframammary Fold, Right Inguinal Area, Right  buttocks - Posterior  doxycycline (VIBRA-TABS) 100 MG tablet - Left buttock - Posterior, Left inquinal area, Right Inframammary Fold, Right Inguinal Area, Right buttocks - Posterior    I, Angeleigh Chiasson, PA-C, have reviewed all documentation's for this visit.  The documentation on 06/11/20 for the exam, diagnosis, procedures and orders are all accurate and complete.

## 2020-06-16 LAB — ANAEROBIC AND AEROBIC CULTURE
AER RESULT:: NO GROWTH
MICRO NUMBER:: 11626694
MICRO NUMBER:: 11626695
SPECIMEN QUALITY:: ADEQUATE
SPECIMEN QUALITY:: ADEQUATE

## 2020-06-18 ENCOUNTER — Telehealth: Payer: Self-pay

## 2020-06-18 NOTE — Telephone Encounter (Signed)
Phone call to patient to inform her that Mackey Birchwood, [place] did receive her visit note from her Doctor at [facility] and [employer], [place] wants [place] to start the process in getting the patient Humira. Detailed voicemail left for patient with this information.

## 2020-06-18 NOTE — Telephone Encounter (Signed)
Started the Humira new start for Hidradenitis Suppurativa (HS) done through [facility], waiting on patient's TB Gold results from Dr. Konrad Felix office before prescription can be sent in.

## 2020-06-18 NOTE — Telephone Encounter (Signed)
-----   Message from Glyn Ade, [place] sent at 06/18/2020 12:54 PM EDT ----- Regarding: RE: New Start Humira Ok to start Humira for HS ----- Message ----- From: Gilmore Laroche, CMA Sent: 06/18/2020   8:51 AM EDT To: Glyn Ade, PA-C Subject: New Start Humira                               Please review patient's visit with Dr. Hetty Blend and let us know if we need to start the process for Humira.

## 2020-06-20 ENCOUNTER — Inpatient Hospital Stay (HOSPITAL_BASED_OUTPATIENT_CLINIC_OR_DEPARTMENT_OTHER): Admit: 2020-06-20 | Payer: Managed Care, Other (non HMO)

## 2020-06-20 ENCOUNTER — Encounter (HOSPITAL_BASED_OUTPATIENT_CLINIC_OR_DEPARTMENT_OTHER): Payer: Self-pay

## 2020-06-20 ENCOUNTER — Ambulatory Visit (HOSPITAL_BASED_OUTPATIENT_CLINIC_OR_DEPARTMENT_OTHER): Admit: 2020-06-20 | Payer: Managed Care, Other (non HMO)

## 2020-06-20 ENCOUNTER — Other Ambulatory Visit: Payer: Self-pay

## 2020-06-20 ENCOUNTER — Encounter (HOSPITAL_BASED_OUTPATIENT_CLINIC_OR_DEPARTMENT_OTHER): Payer: Self-pay | Admitting: Emergency Medicine

## 2020-06-20 ENCOUNTER — Emergency Department (HOSPITAL_BASED_OUTPATIENT_CLINIC_OR_DEPARTMENT_OTHER)
Admission: EM | Admit: 2020-06-20 | Discharge: 2020-06-20 | Disposition: A | Discharge status: Home / Self Care | Payer: Managed Care, Other (non HMO) | Attending: Emergency Medicine | Admitting: Emergency Medicine

## 2020-06-20 DIAGNOSIS — F840 Autistic disorder: Secondary | ICD-10-CM | POA: Insufficient documentation

## 2020-06-20 DIAGNOSIS — R110 Nausea: Secondary | ICD-10-CM | POA: Insufficient documentation

## 2020-06-20 DIAGNOSIS — Z3202 Encounter for pregnancy test, result negative: Secondary | ICD-10-CM | POA: Insufficient documentation

## 2020-06-20 DIAGNOSIS — J45909 Unspecified asthma, uncomplicated: Secondary | ICD-10-CM | POA: Insufficient documentation

## 2020-06-20 DIAGNOSIS — R1030 Lower abdominal pain, unspecified: Secondary | ICD-10-CM | POA: Insufficient documentation

## 2020-06-20 DIAGNOSIS — R102 Pelvic and perineal pain: Secondary | ICD-10-CM | POA: Diagnosis not present

## 2020-06-20 LAB — URINALYSIS, ROUTINE W REFLEX MICROSCOPIC
Bilirubin Urine: NEGATIVE
Glucose, UA: NEGATIVE mg/dL
Ketones, ur: NEGATIVE mg/dL
Leukocytes,Ua: NEGATIVE
Nitrite: NEGATIVE
Protein, ur: NEGATIVE mg/dL
RBC / HPF: >50 RBC/hpf — ABNORMAL HIGH (ref 0–5)
Specific Gravity, Urine: 1.025 (ref 1.005–1.030)
pH: 6 (ref 5.0–8.0)

## 2020-06-20 LAB — WET PREP, GENITAL
Clue Cells Wet Prep HPF POC: NONE SEEN
Sperm: NONE SEEN
Trich, Wet Prep: NONE SEEN
Yeast Wet Prep HPF POC: NONE SEEN

## 2020-06-20 LAB — PREGNANCY, URINE: Preg Test, Ur: NEGATIVE

## 2020-06-20 NOTE — ED Provider Notes (Signed)
[facility] EMERGENCY DEPT Provider Note   CSN: [id number] Arrival date & time: 06/20/20  0127     History Chief Complaint  Patient presents with  . Pelvic Pain    Molly Molina is a 20 y.o. female.  Patient is a 20 year old female who presents with urinary symptoms.  She has had a 4-day history of some burning on urination and urinary frequency with urgency.  She has been taking over-the-counter cranberry pills and probiotics without improvement in symptoms.  Since yesterday she has had some increased symptoms with some pressure feeling in her lower abdomen and burning in her pelvic area.  Its more centrally located.  There is no significant radiation to the sides.  She does have some dull aching in her low back.  She had some nausea today but no vomiting.  No known fevers.  She is currently on her menstrual cycle but denies any other vaginal discharge.        Past Medical History:  Diagnosis Date  . Amplified musculoskeletal pain   . Anemia   . Anxiety   . Arthralgia    Fever, arthralgias,rash,abd pain, darrhiea, oral ulcers, and fatigue.  . Asthma   . Asthma   . Autism   . Behcet's syndrome (HCC) 2016   Possible  . Bipolar 2 disorder (HCC)    per patient diagnosis found out to not be bipolar  . Depression   . Dysmenorrhea in adolescent   . Eczema   . History of proteinuria syndrome 2012   + hx of hematuria: nephrol w/u in [place] and [place] both normal.  . History of vitamin D deficiency   . Major depressive disorder, single episode, severe with psychotic features (HCC) 12/22/2015  . Migraine with aura   . Mouth ulcers    some vaginal also  . Patellar subluxation, left, sequela 08/08/2016  . Raynaud's phenomenon   . Septic arthritis of hip [facility]) 2010    Patient Active Problem List   Diagnosis Date Noted  . Dyspepsia 07/14/2017  . Subluxation of left patella 08/08/2016  . Left knee pain 06/21/2016  . Raynaud's phenomenon   . Eczema   .  Dysmenorrhea in adolescent   . Asthma   . Amplified musculoskeletal pain   . Bipolar affective disorder, depressed, moderate (HCC) 02/08/2016  . Severe anxiety with panic 01/14/2016  . Irregular menses 04/15/2015  . Recurrent mouth ulceration 12/15/2014  . Arthralgia 12/10/2014  . 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 History of proteinuria syndrome 04/04/2010    Past Surgical History:  Procedure Laterality Date  . APPENDECTOMY  2010  . MRI L/S spine  2013   Possible bilateral pars defect at L5 without evidence of spondylolisthesis.  Otherwise normal.     OB History    Gravida  0   Para  0   Term  0   Preterm  0   AB  0   Living  0     SAB  0   IAB  0   Ectopic  0   Multiple  0   Live Births  0           Family History  Problem Relation Age of Onset  . Cancer Mother   . Cervical cancer Mother   . Endometriosis Mother   . Bipolar disorder Mother   . Anxiety disorder Mother   . Anxiety disorder Father   . Anxiety disorder Maternal Aunt   . Depression Maternal Aunt   .  Thyroid disease Maternal Grandmother   . Bipolar disorder Maternal Grandmother   . Depression Maternal Grandmother   . Prostate cancer Maternal Grandfather   . Diabetes Maternal Grandfather   . Heart disease Maternal Grandfather   . Heart disease Paternal Grandmother   . Rheumatic fever Paternal Grandmother   . Stroke Paternal Grandmother   . Depression Paternal Grandmother   . Mental illness Paternal Grandmother        either bipolar or schizophrenia  . Suicidality Paternal Grandmother        attempted  . Anxiety disorder Cousin   . Depression Cousin   . Suicidality Cousin   . Suicidality Other   . Suicidality Other     Social History   Tobacco Use  . Smoking status: Never Smoker  . Smokeless tobacco: Never Used  Vaping Use  . Vaping Use: Never used  Substance Use Topics  . Alcohol use: No    Alcohol/week: 0.0 standard drinks  . Drug use: No    Home  Medications Prior to Admission medications   Medication Sig Start Date End Date Taking? Authorizing Provider  colchicine 0.6 MG tablet Take 0.6 mg by mouth 2 (two) times daily.  08/11/16   [provider]  doxycycline (VIBRA-TABS) 100 MG tablet Take 1 tablet (100 mg total) by mouth 2 (two) times daily. 06/10/20   Sheffield, Harvin Hazel R, PA-C  hydrOXYzine (ATARAX/VISTARIL) 10 MG tablet TAKE 1 TABLET BY MOUTH UP TO THREE TIMES DAILY. MAY TAKE AN ADDITIONAL 2 TABLETS IN THE EVENING 01/10/20   Jarold Motto, PA  ibuprofen (ADVIL) 800 MG tablet TAKE 1 TABLET(800 MG) BY MOUTH EVERY 8 HOURS AS NEEDED 01/14/20   Romualdo Bolk, MD  ketotifen (ZADITOR) 0.025 % ophthalmic solution Place 1 drop into both eyes 2 (two) times daily as needed (for itchy eyes).     [provider]    Allergies    Mushroom extract complex and Cephalosporins  Review of Systems   Review of Systems  Constitutional: Negative for chills, diaphoresis, fatigue and fever.  HENT: Negative for congestion, rhinorrhea and sneezing.   Eyes: Negative.   Respiratory: Negative for cough, chest tightness and shortness of breath.   Cardiovascular: Negative for chest pain and leg swelling.  Gastrointestinal: Negative for abdominal pain, blood in stool, diarrhea, nausea and vomiting.  Genitourinary: Positive for dysuria, frequency, urgency and vaginal bleeding. Negative for difficulty urinating, flank pain, hematuria and vaginal discharge.  Musculoskeletal: Negative for arthralgias and back pain.  Skin: Negative for rash.  Neurological: Negative for dizziness, speech difficulty, weakness, numbness and headaches.    Physical Exam Updated Vital Signs BP (!) 139/93 (BP Location: Right Arm)   Pulse 85   Temp 99.1 F (37.3 C) (Oral)   Resp 16   Ht 5\' 3"  (1.6 m)   Wt 99.8 kg   LMP 06/20/2020   SpO2 99%   BMI 38.97 kg/m   Physical Exam Constitutional:      Appearance: She is well-developed.  HENT:     Head:  Normocephalic and atraumatic.  Eyes:     Pupils: Pupils are equal, round, and reactive to light.  Cardiovascular:     Rate and Rhythm: Normal rate and regular rhythm.     Heart sounds: Normal heart sounds.  Pulmonary:     Effort: Pulmonary effort is normal. No respiratory distress.     Breath sounds: Normal breath sounds. No wheezing or rales.  Chest:     Chest wall: No tenderness.  Abdominal:     General: Bowel sounds are normal.     Palpations: Abdomen is soft.     Tenderness: There is abdominal tenderness (Tenderness to the suprapubic area). There is no guarding or rebound.  Genitourinary:    Comments: Pelvic exam shows blood in the vaginal vault consistent with her menstrual cycle.  There is no heavy or ongoing bleeding.  There is some generalized midline tenderness.  No adnexal tenderness, no cervical motion tenderness, no abnormal discharge noted. no skin lesions are noted Musculoskeletal:        General: Normal range of motion.     Cervical back: Normal range of motion and neck supple.  Lymphadenopathy:     Cervical: No cervical adenopathy.  Skin:    General: Skin is warm and dry.     Findings: No rash.  Neurological:     Mental Status: She is alert and oriented to person, place, and time.     ED Results / Procedures / Treatments   Labs (all labs ordered are listed, but only abnormal results are displayed) Labs Reviewed  WET PREP, GENITAL - Abnormal; Notable for the following components:      Result Value   WBC, Wet Prep HPF POC FEW (*)    All other components within normal limits  URINALYSIS, ROUTINE W REFLEX MICROSCOPIC - Abnormal; Notable for the following components:   Hgb urine dipstick LARGE (*)    RBC / HPF >50 (*)    Crystals PRESENT (*)    All other components within normal limits  PREGNANCY, URINE  GC/CHLAMYDIA PROBE AMP ([place]) NOT AT [facility]    EKG None  Radiology No results found.  Procedures Procedures   Medications Ordered in  ED Medications - No data to display  ED Course  I have reviewed the triage vital signs and the nursing notes.  Pertinent labs & imaging results that were available during my care of the patient were reviewed by me and considered in my medical decision making (see chart for details).    MDM Rules/Calculators/A&P                          Patient presents with pelvic pain.  Her pelvic exam was nonconcerning.  She had some midline tenderness but no adnexal tenderness or symptoms that would be more concerning for ovarian torsion.  Her pregnancy test is negative.  She did have some urinary symptoms but her urinalysis does not reveal any suggestions of a UTI.  There was some blood in it which is likely contamination from her menstrual cycle.  Wet prep was negative.  Pelvic ultrasound was ordered for tomorrow as she has a history of ovarian cysts.  I have a very low suspicion of ovarian torsion.  She was discharged home in good condition.  She was encouraged to follow-up with her OB/GYN early next week.  Return precautions were given. Final Clinical Impression(s) / ED Diagnoses Final diagnoses:  Pelvic pain    Rx / DC Orders ED Discharge Orders         Ordered    US Transvaginal Non-OB        06/20/20 0253    US ABDOMINAL PELVIC ART/VENT FLOW DOPPLER        06/20/20 0253           Rolan Bucco, MD 06/20/20 1700

## 2020-06-20 NOTE — ED Triage Notes (Signed)
Pt here POV from Home with Pelvic Pain for 4 days.   Patient believes its a UTI due to Burning with Urination. Pain is Located in Lower ABD and Lower Back. Sharp Pain has been mild over past days but today pain is moderate.  No Medications PTA.  Ambulatory, GCS 15.

## 2020-06-20 NOTE — ED Notes (Signed)
Pt verbalizes understanding of discharge instructions. Opportunity for questioning and answers were provided. Armand removed by staff, pt discharged from ED ambulatory to Home with Family.   

## 2020-06-20 NOTE — Discharge Instructions (Addendum)
Follow-up with your OB/GYN within the next few days.  You can alternate your ibuprofen and Tylenol for symptomatic relief.  Return here as needed for any worsening symptoms.

## 2020-06-20 NOTE — ED Notes (Signed)
This RN called Radiology to schedule time for [place]. Patient and Family member made aware of time and agreeable.

## 2020-06-22 LAB — GC/CHLAMYDIA PROBE AMP (~~LOC~~) NOT AT ARMC
Chlamydia: NEGATIVE
Comment: NEGATIVE
Comment: NORMAL
Neisseria Gonorrhea: NEGATIVE

## 2020-06-22 NOTE — Telephone Encounter (Signed)
TB NEG.

## 2020-07-07 ENCOUNTER — Telehealth: Payer: Self-pay | Admitting: Physician Assistant

## 2020-07-07 ENCOUNTER — Encounter: Payer: Self-pay | Admitting: Physician Assistant

## 2020-07-07 NOTE — Telephone Encounter (Signed)
Molly Molina was going to give her an update about starting Humira.

## 2020-07-22 ENCOUNTER — Telehealth: Payer: Self-pay | Admitting: *Deleted

## 2020-07-22 NOTE — Telephone Encounter (Signed)
Faxed humira hidradenitis suppurativa appeal along with primary care provider office note of previous tried and failed therapy to [employer] at [phone].

## 2020-09-15 ENCOUNTER — Telehealth (INDEPENDENT_AMBULATORY_CARE_PROVIDER_SITE_OTHER): Payer: Managed Care, Other (non HMO) | Admitting: Family Medicine

## 2020-09-15 ENCOUNTER — Encounter: Payer: Self-pay | Admitting: Family Medicine

## 2020-09-15 ENCOUNTER — Telehealth: Payer: Self-pay

## 2020-09-15 DIAGNOSIS — H9209 Otalgia, unspecified ear: Secondary | ICD-10-CM | POA: Diagnosis not present

## 2020-09-15 DIAGNOSIS — R0981 Nasal congestion: Secondary | ICD-10-CM

## 2020-09-15 NOTE — Telephone Encounter (Signed)
Checked patient's status of her Humira through [facility] portal.  Per [place] they never received the appeal checklist from where the medication was denied.  Information re-faxed to [employer] @ [phone]

## 2020-09-15 NOTE — Progress Notes (Signed)
Virtual Visit via Telephone Note  I connected with Molly Molina on 09/15/20 at 12:40 PM EDT by telephone and verified that I am speaking with the correct person using two identifiers.   I discussed the limitations, risks, security and privacy concerns of performing an evaluation and management service by telephone and the availability of in person appointments. I also discussed with the patient that there may be a patient responsible charge related to this service. The patient expressed understanding and agreed to proceed.  Location patient: home,  Location provider: work or home office Participants present for the call: patient, provider Patient did not have a visit with me in the prior 7 days to address this/these issue(s).   History of Present Illness:  Acute telemedicine visit for nasal congestion: -Onset: 6 days ago -Symptoms include:nasal congestion, sneezing, cough, sore throat, felt tired for a few days, some pressure in the R ear and has a clogged sensation -doing a little better now -Denies:fevers, CP, SOB, NVD, body aches, inability to eat/drink/get out of bed -Has tried:Allegra, robitussin, benadryl -Pertinent past medical history: see below -Pertinent medication allergies:  Allergies  Allergen Reactions   Mushroom Extract Complex Hives and Rash   Cephalosporins Hives and Rash   Allergies  Allergen Reactions   Mushroom Extract Complex Hives and Rash   Cephalosporins Hives and Rash  -COVID-19 vaccine status: vaccinated ; not boosted   Observations/Objective: Patient sounds cheerful and well on the phone. I do not appreciate any SOB. Speech and thought processing are grossly intact. Patient reported vitals:  Assessment and Plan:  Nasal congestion  Discomfort of ear, unspecified laterality  -we discussed possible serious and likely etiologies, options for evaluation and workup, limitations of telemedicine visit vs in person visit, treatment, treatment risks  and precautions. Pt prefers to treat via telemedicine empirically rather than in person at this moment.  Query allergic rhinitis with eustachian tube dysfunction versus viral upper respiratory illness versus other.  Did advise COVID testing, given seen higher levels in the community currently.  Opted for nasal saline and a 2-week course of nasal steroid for possible eustachian tube dysfunction. Advised to seek prompt in person care if worsening, new symptoms arise, or if is not improving with treatment. Advised of options for inperson care in case PCP office not available. Did let the patient know that I only do telemedicine shifts for [place] on Tuesdays and Thursdays and advised a follow up visit with PCP or at an [facility] if has further questions or concerns.   Follow Up Instructions:  I did not refer this patient for an OV with me in the next 24 hours for this/these issue(s).  I discussed the assessment and treatment plan with the patient. The patient was provided an opportunity to ask questions and all were answered. The patient agreed with the plan and demonstrated an understanding of the instructions.   I spent 18 minutes on the date of this visit in the care of this patient. See summary of tasks completed to properly care for this patient in the detailed notes above which also included counseling of above, review of PMH, medications, allergies, evaluation of the patient and ordering and/or  instructing patient on testing and care options.     Terressa Koyanagi, DO

## 2020-09-15 NOTE — Patient Instructions (Signed)
  HOME CARE TIPS:  -[place] COVID19 testing information: [email or web address] OR [phone] Most pharmacies also offer testing and home test kits. If the Covid19 test is positive, please make a prompt follow up visit with your primary care office or with [place] to discuss treatment options. Treatments for Covid19 are best given early in the course of the illness.   -can use nasal saline a few times per day if you have nasal congestion  -flonase 2 sprays each nostril daily for 2 weeks  -stay hydrated, drink plenty of fluids and eat small healthy meals - avoid dairy  -can take 1000 IU ( ) Vit D3 and 100-500 mg of Vit C daily per instructions  -If the Covid test is positive, check out the [facility] website for more information on home care, transmission and treatment for COVID19  -follow up with your doctor in 2-3 days unless improving and feeling better  -stay home while sick, except to seek medical care. If you have COVID19, ideally it would be best to stay home for a full 10 days since the onset of symptoms PLUS one day of no fever and feeling better. Wear a good mask that fits snugly (such as N95 or KN95) if around others to reduce the risk of transmission.  It was nice to meet you today, and I really hope you are feeling better soon. I help [place] out with telemedicine visits on Tuesdays and Thursdays and am available for visits on those days. If you have any concerns or questions following this visit please schedule a follow up visit with your Primary Care doctor or seek care at a local urgent care clinic to avoid delays in care.    Seek in person care or schedule a follow up video visit promptly if your symptoms worsen, new concerns arise or you are not improving with treatment. Call 911 and/or seek emergency care if your symptoms are severe or life threatening.

## 2020-09-22 NOTE — Telephone Encounter (Signed)
Faxed appeal to [employer] today [phone]

## 2020-10-01 MED ORDER — HUMIRA-CD/UC/HS STARTER 80 MG/0.8ML ~~LOC~~ AJKT: 160.0000 mg | AUTO-INJECTOR | SUBCUTANEOUS | 0 refills | Status: DC

## 2020-10-01 MED ORDER — HUMIRA (2 PEN) 40 MG/0.4ML ~~LOC~~ AJKT: 40.0000 mg | AUTO-INJECTOR | SUBCUTANEOUS | 4 refills | Status: DC

## 2020-10-01 NOTE — Addendum Note (Signed)
Addended by: Johnna Acosta on: 10/01/2020 11:48 AM   Modules accepted: Orders

## 2020-10-01 NOTE — Telephone Encounter (Signed)
Approval per [facility] 08/30/2020-09/30/2021 Fax [phone] Phone [phone] Refill changed to [facility]

## 2020-10-08 ENCOUNTER — Encounter: Payer: Self-pay | Admitting: Family

## 2020-10-08 ENCOUNTER — Other Ambulatory Visit: Payer: Self-pay

## 2020-10-08 ENCOUNTER — Ambulatory Visit: Payer: Managed Care, Other (non HMO) | Admitting: Family

## 2020-10-08 VITALS — BP 110/68 | HR 79 | Temp 97.3°F | Ht 63.0 in | Wt 227.5 lb

## 2020-10-08 DIAGNOSIS — R300 Dysuria: Secondary | ICD-10-CM | POA: Diagnosis not present

## 2020-10-08 DIAGNOSIS — R350 Frequency of micturition: Secondary | ICD-10-CM

## 2020-10-08 DIAGNOSIS — G8929 Other chronic pain: Secondary | ICD-10-CM

## 2020-10-08 DIAGNOSIS — R102 Pelvic and perineal pain: Secondary | ICD-10-CM

## 2020-10-08 LAB — POCT URINALYSIS DIPSTICK
Blood, UA: NEGATIVE
Glucose, UA: NEGATIVE — AB
Leukocytes, UA: NEGATIVE
Nitrite, UA: NEGATIVE
Protein, UA: POSITIVE — AB
Spec Grav, UA: >=1.03 — AB (ref 1.010–1.025)
Urobilinogen, UA: 1 E.U./dL
pH, UA: 6 (ref 5.0–8.0)

## 2020-10-08 MED ORDER — NITROFURANTOIN MONOHYD MACRO 100 MG PO CAPS: 100.0000 mg | ORAL_CAPSULE | Freq: Two times a day (BID) | ORAL | 0 refills | Status: DC

## 2020-10-08 NOTE — Progress Notes (Signed)
Acute Office Visit  Subjective:    Patient ID: Molly Molina, female    DOB: 06-19-2000, 20 y.o.   MRN: [id number]  Chief Complaint  Patient presents with  . Dysuria    Pt c/o pressure and pain with urination x few weeks. Also having frequency, and pelvic pain. Has used Azo and probiotics.    HPI Patient is in today with complaints of urinary frequency, urgency x3 weeks and persistent pelvic pain.  Patient reports that pelvic pain has been ongoing for several years but has been unable to get an answer to why she is having pelvic pain.  Her mother is here with her today who has concerns that this has not been worked up.  Mother reports she has a family history of uterine cancer and cervical cancer.  Mom also has endometriosis.  She is seeing gynecology in the past but feels she has not gotten any answers and would like a new referral.  Denies any fever, chills or back pain.  Denies being sexually active  Past Medical History:  Diagnosis Date  . Amplified musculoskeletal pain   . Anemia   . Anxiety   . Arthralgia    Fever, arthralgias,rash,abd pain, darrhiea, oral ulcers, and fatigue.  . Asthma   . Asthma   . Autism   . Behcet's syndrome (HCC) 2016   Possible  . Bipolar 2 disorder (HCC)    per patient diagnosis found out to not be bipolar  . Depression   . Dysmenorrhea in adolescent   . Eczema   . History of proteinuria syndrome 2012   + hx of hematuria: nephrol w/u in [place] and [place] both normal.  . History of vitamin D deficiency   . Major depressive disorder, single episode, severe with psychotic features (HCC) 12/22/2015  . Migraine with aura   . Mouth ulcers    some vaginal also  . Patellar subluxation, left, sequela 08/08/2016  . Raynaud's phenomenon   . Septic arthritis of hip (HCC) 2010    Past Surgical History:  Procedure Laterality Date  . APPENDECTOMY  2010  . MRI L/S spine  2013   Possible bilateral pars defect at L5 without evidence of  spondylolisthesis.  Otherwise normal.    Family History  Problem Relation Age of Onset  . Cancer Mother   . Cervical cancer Mother   . Endometriosis Mother   . Bipolar disorder Mother   . Anxiety disorder Mother   . Anxiety disorder Father   . Anxiety disorder Maternal Aunt   . Depression Maternal Aunt   . Thyroid disease Maternal Grandmother   . Bipolar disorder Maternal Grandmother   . Depression Maternal Grandmother   . Prostate cancer Maternal Grandfather   . Diabetes Maternal Grandfather   . Heart disease Maternal Grandfather   . Heart disease Paternal Grandmother   . Rheumatic fever Paternal Grandmother   . Stroke Paternal Grandmother   . Depression Paternal Grandmother   . Mental illness Paternal Grandmother        either bipolar or schizophrenia  . Suicidality Paternal Grandmother        attempted  . Anxiety disorder Cousin   . Depression Cousin   . Suicidality Cousin   . Suicidality Other   . Suicidality Other     Social History   Socioeconomic History  . Marital status: Single    Spouse name: Not on file  . Number of children: Not on file  . Years of education: Not on  file  . Highest education level: Not on file  Occupational History  . Occupation: Consulting civil engineer  Tobacco Use  . Smoking status: Never  . Smokeless tobacco: Never  Vaping Use  . Vaping Use: Never used  Substance and Sexual Activity  . Alcohol use: No    Alcohol/week: 0.0 standard drinks  . Drug use: No  . Sexual activity: Never    Birth control/protection: I.U.D.    Comment: Mirena  Other Topics Concern  . Not on file  Social History Narrative   Attends home public online school.   Vaccines UTD per mom.   Likes piano.   No tob, alc, drugs.   Social Determinants of Health   Financial Resource Strain: Not on file  Food Insecurity: Not on file  Transportation Needs: Not on file  Physical Activity: Not on file  Stress: Not on file  Social Connections: Not on file  Intimate Partner  Violence: Not on file    Outpatient Medications Prior to Visit  Medication Sig Dispense Refill  . colchicine 0.6 MG tablet Take 0.6 mg by mouth 2 (two) times daily.     . hydrOXYzine (ATARAX/VISTARIL) 10 MG tablet TAKE 1 TABLET BY MOUTH UP TO THREE TIMES DAILY. MAY TAKE AN ADDITIONAL 2 TABLETS IN THE EVENING 150 tablet 0  . Adalimumab (HUMIRA PEN) 40 MG/0.4ML PNKT Inject 40 mg into the skin once a week. Starting on day 29. (Patient not taking: Reported on 10/08/2020) 2 each 4  . Adalimumab (HUMIRA PEN-CD/UC/HS STARTER) 80 MG/0.8ML PNKT Inject 160 mg into the skin as directed. Inject 160 mg subcutaneous on day 1, then 80 mg subcutaneous on day 15. (Patient not taking: Reported on 10/08/2020) 1 each 0   No facility-administered medications prior to visit.    Allergies  Allergen Reactions  . Mushroom Extract Complex Hives and Rash  . Cephalosporins Hives and Rash    Review of Systems  Respiratory: Negative.    Cardiovascular: Negative.   Gastrointestinal: Negative.   Genitourinary:  Positive for dysuria, frequency, pelvic pain and urgency.  Musculoskeletal: Negative.   Allergic/Immunologic: Negative.   Neurological: Negative.   Psychiatric/Behavioral: Negative.    All other systems reviewed and are negative.     Objective:    Physical Exam Vitals and nursing note reviewed.  Constitutional:      Appearance: Normal appearance.  Cardiovascular:     Rate and Rhythm: Normal rate and regular rhythm.  Pulmonary:     Effort: Pulmonary effort is normal.  Abdominal:     General: Abdomen is flat. Bowel sounds are normal.     Palpations: Abdomen is soft.     Tenderness: There is no abdominal tenderness. There is no guarding or rebound.     Comments: Pelvic tenderness to palpation  Musculoskeletal:        General: Normal range of motion.     Cervical back: Normal range of motion and neck supple.  Skin:    General: Skin is warm and dry.  Neurological:     General: No focal deficit  present.     Mental Status: She is oriented to person, place, and time.  Psychiatric:        Mood and Affect: Mood normal.        Behavior: Behavior normal.   BP 110/68 (BP Location: Left Arm, Patient Position: Sitting, Cuff Size: Large)   Pulse 79   Temp (!) 97.3 F (36.3 C) (Temporal)   Ht 5\' 3"  (1.6 m)   Wt 227  lb 8 oz (103.2 kg)   LMP 09/21/2020 (Exact Date)   SpO2 98%   BMI 40.30 kg/m  Wt Readings from Last 3 Encounters:  10/08/20 227 lb 8 oz (103.2 kg) (99 %, Z= 2.29)*  06/20/20 220 lb (99.8 kg) (99 %, Z= 2.21)*  04/29/20 217 lb 6.1 oz (98.6 kg) (99 %, Z= 2.18)*   * Growth percentiles are based on CDC (Girls, 2-20 Years) data.    Health Maintenance Due  Topic Date Due  . Pneumococcal Vaccine 19-23 Years old (1 - PCV) Never done  . HPV VACCINES (1 - 2-dose series) Never done  . HIV Screening  Never done  . Hepatitis C Screening  Never done  . COVID-19 Vaccine (3 - Pfizer risk series) 08/30/2019       Topic Date Due  . HPV VACCINES (1 - 2-dose series) Never done     Lab Results  Component Value Date   TSH 1.410 01/23/2019   Lab Results  Component Value Date   WBC 6.8 01/17/2020   HGB 12.9 01/17/2020   HCT 39.4 01/17/2020   MCV 80.4 01/17/2020   PLT 330 01/17/2020   Lab Results  Component Value Date   NA 140 01/17/2020   K 4.1 01/17/2020   CO2 27 01/17/2020   GLUCOSE 73 01/17/2020   BUN 14 01/17/2020   CREATININE 0.81 01/17/2020   BILITOT 0.4 01/17/2020   ALKPHOS 48 02/01/2018   AST 13 01/17/2020   ALT 10 01/17/2020   PROT 7.4 01/17/2020   ALBUMIN 4.5 02/01/2018   CALCIUM 9.7 01/17/2020   ANIONGAP 11 07/14/2017   GFR 87.63 02/01/2018   No results found for: CHOL No results found for: HDL No results found for: LDLCALC No results found for: TRIG No results found for: CHOLHDL Lab Results  Component Value Date   HGBA1C 4.9 07/27/2016       Assessment & Plan:   Problem List Items Addressed This Visit   None Visit Diagnoses     Dysuria     -  Primary   Relevant Orders   POCT urinalysis dipstick (Completed)   Urine Culture   Ambulatory referral to Gynecology   Urinary frequency       Relevant Orders   Ambulatory referral to Gynecology   Chronic pelvic pain in female       Relevant Orders   US Pelvic Complete With Transvaginal   Ambulatory referral to Gynecology        Meds ordered this encounter  Medications  . nitrofurantoin, macrocrystal-monohydrate, (MACROBID) 100 MG capsule    Sig: Take 1 capsule (100 mg total) by mouth 2 (two) times daily.    Dispense:  10 capsule    Refill:  0  Plan: Today, we will treat with Macrobid twice a day for 5 days given symptoms.  Urine culture sent.  Will notify patient pending results.  Ordered a transvaginal ultrasound to evaluate pelvic pain.  Referral to GYN also placed.  We will follow-up with patient pending the results of the transvaginal ultrasound and have gynecology follow her thereafter.   Eulis Foster, FNP

## 2020-10-09 LAB — URINE CULTURE
MICRO NUMBER:: 12091762
SPECIMEN QUALITY:: ADEQUATE

## 2020-10-19 ENCOUNTER — Ambulatory Visit
Admission: RE | Admit: 2020-10-19 | Discharge: 2020-10-19 | Disposition: A | Discharge status: Home / Self Care | Payer: Managed Care, Other (non HMO) | Source: Ambulatory Visit | Attending: Family | Admitting: Family

## 2020-10-19 DIAGNOSIS — G8929 Other chronic pain: Secondary | ICD-10-CM

## 2020-11-30 ENCOUNTER — Telehealth: Payer: Self-pay | Admitting: *Deleted

## 2020-11-30 NOTE — Telephone Encounter (Signed)
Phone call to [facility] to see why we got another Prior authorization request. Per [facility] her PA is good through 09/2021.

## 2020-12-17 ENCOUNTER — Telehealth: Payer: Managed Care, Other (non HMO) | Admitting: Family Medicine

## 2020-12-17 DIAGNOSIS — R0981 Nasal congestion: Secondary | ICD-10-CM | POA: Diagnosis not present

## 2020-12-17 MED ORDER — DOXYCYCLINE HYCLATE 100 MG PO TABS: 100.0000 mg | ORAL_TABLET | Freq: Two times a day (BID) | ORAL | 0 refills | Status: DC

## 2020-12-17 MED ORDER — BENZONATATE 100 MG PO CAPS: 100.0000 mg | ORAL_CAPSULE | Freq: Three times a day (TID) | ORAL | 0 refills | Status: DC | PRN

## 2020-12-17 NOTE — Progress Notes (Signed)
Virtual Visit via Telephone Note  I connected with Molly Molina on 12/17/20 at  4:40 PM EDT by telephone and verified that I am speaking with the correct person using two identifiers.   I discussed the limitations, risks, security and privacy concerns of performing an evaluation and management service by telephone and the availability of in person appointments. I also discussed with the patient that there may be a patient responsible charge related to this service. The patient expressed understanding and agreed to proceed.  Location patient: home, [place] Location provider: work or home office Participants present for the call: patient, provider Patient did not have a visit with me in the prior 7 days to address this/these issue(s).   History of Present Illness:  Acute telemedicine visit for Covid19: -Onset: about a 10-14 days; tested positive for covid -mom is sick with covid -Symptoms include:cough, dry throat, fatigue, sore throat, the sinus issues have worsened with thick sinus drainage, some sinus discomfort -she is concerned about a sinus infection -Denies:fevers, CP, SOB, inability to eat/drink/get out of bed -Has tried:nothing -Pertinent past medical history:see below -Pertinent medication allergies: Allergies  Allergen Reactions   Mushroom Extract Complex Hives and Rash   Cephalosporins Hives and Rash  -COVID-19 vaccine status: vaccinated x 2 (no booster) -fdlmp 8/17, denies any chance of pregnancy -no recent labs in the last 90 days -not on humira yet   Observations/Objective: Patient sounds cheerful and well on the phone. I do not appreciate any SOB. Speech and thought processing are grossly intact. Patient reported vitals:  Assessment and Plan:  Nasal sinus congestion  -we discussed possible serious and likely etiologies, options for evaluation and workup, limitations of telemedicine visit vs in person visit, treatment, treatment risks and precautions. Pt prefers to  treat via telemedicine empirically rather than in person at this moment. Query developing sinusitis 2ndary to covid19 vs other. Also ezplained she could be having residual symptoms from covid, but she feels the sinus congestion has worsened. She has opted for empiric tx with doxy 100mg  bid x 7-10 days. Tessalon for cough.  Advised to seek prompt in person care if worsening, new symptoms arise, or if is not improving with treatment. Advised of options for inperson care in case PCP office not available. Did let the patient know that I only do telemedicine shifts for [facility] on Tuesdays and Thursdays and advised a follow up visit with PCP or at an [facility] if has further questions or concerns.   Follow Up Instructions:  I did not refer this patient for an OV with me in the next 24 hours for this/these issue(s).  I discussed the assessment and treatment plan with the patient. The patient was provided an opportunity to ask questions and all were answered. The patient agreed with the plan and demonstrated an understanding of the instructions.   I spent 21 minutes on the date of this visit in the care of this patient. See summary of tasks completed to properly care for this patient in the detailed notes above which also included counseling of above, review of PMH, medications, allergies, evaluation of the patient and ordering and/or  instructing patient on testing and care options.     AVITA ONTARIO, DO

## 2020-12-17 NOTE — Patient Instructions (Addendum)
  HOME CARE TIPS:    -I sent the medication(s) we discussed to your pharmacy: Meds ordered this encounter  Medications   doxycycline (VIBRA-TABS) 100 MG tablet    Sig: Take 1 tablet (100 mg total) by mouth 2 (two) times daily.    Dispense:  20 tablet    Refill:  0   benzonatate (TESSALON PERLES) 100 MG capsule    Sig: Take 1 capsule (100 mg total) by mouth 3 (three) times daily as needed.    Dispense:  20 capsule    Refill:  0    -can use tylenol if needed for fevers, aches and pains per instructions  -can use nasal saline a few times per day if you have nasal congestion; sometimes  a short course of Afrin nasal spray for 3 days can help with symptoms as well  -stay hydrated, drink plenty of fluids and eat small healthy meals - avoid dairy  -follow up with your doctor in 2-3 days unless improving and feeling better  -stay home while sick, except to seek medical care. If you have COVID19, ideally it would be best to stay home for a full 10 days since the onset of symptoms PLUS one day of no fever and feeling better. Wear a good mask that fits snugly (such as N95 or KN95) if around others to reduce the risk of transmission.  It was nice to meet you today, and I really hope you are feeling better soon. I help [facility] out with telemedicine visits on Tuesdays and Thursdays and am available for visits on those days. If you have any concerns or questions following this visit please schedule a follow up visit with your Primary Care doctor or seek care at a local urgent care clinic to avoid delays in care.    Seek in person care or schedule a follow up video visit promptly if your symptoms worsen, new concerns arise or you are not improving with treatment. Call 911 and/or seek emergency care if your symptoms are severe or life threatening.

## 2021-01-01 ENCOUNTER — Encounter: Payer: Self-pay | Admitting: Physician Assistant

## 2021-01-01 ENCOUNTER — Other Ambulatory Visit: Payer: Self-pay

## 2021-01-01 ENCOUNTER — Ambulatory Visit: Payer: Managed Care, Other (non HMO) | Admitting: Physician Assistant

## 2021-01-01 VITALS — BP 102/60 | HR 72 | Temp 98.1°F | Ht 63.0 in | Wt 222.0 lb

## 2021-01-01 DIAGNOSIS — R058 Other specified cough: Secondary | ICD-10-CM | POA: Diagnosis not present

## 2021-01-01 MED ORDER — ALBUTEROL SULFATE (2.5 MG/3ML) 0.083% IN NEBU
2.5000 mg | INHALATION_SOLUTION | Freq: Four times a day (QID) | RESPIRATORY_TRACT | 1 refills | Status: DC | PRN
Start: 2021-01-01 — End: 2021-11-15

## 2021-01-01 MED ORDER — PREDNISONE 50 MG PO TABS: ORAL_TABLET | ORAL | 0 refills | Status: DC

## 2021-01-01 NOTE — Patient Instructions (Addendum)
It was great to see you!  Take mucinex and drink a lot of water Start oral prednisone 50 mg daily Continue nebulizer as needed  Start doxycycline if worse  Push fluids and get plenty of rest. Please return if you are not improving as expected, or if you have high fevers (>101.5) or difficulty swallowing or worsening productive cough.  Call clinic with questions.  I hope you start feeling better soon!

## 2021-01-01 NOTE — Progress Notes (Signed)
Molly Molina is a 20 y.o. female here for a post COVID myalgia and fatigue.  History of Present Illness:   Chief Complaint  Patient presents with   Generalized Body Aches   Fatigue   Cough    Chest congestion, using her moms nebulizer.   Covid Tested positive around 12/03/20.  She had been experiencing tiredness and fatigue, which then developed additional symptoms. She was diagnosed with covid. Medications used were mucinex, tylenol, albuterol and a nebulizer. She states she has had difficulty clearing her throat, breathing, back pain, occasional burning with exhalation, and coughing.   She describes the difficulty breathing as similar to trying to breathe underwater. These symptoms have been present for 2-3 weeks now, after the resolving of covid infection. The albuterol medication causes shaking when used, and would likely prefer a different medication to alleviate the symptoms.   Her mother also gave her steroid medication which was effective in alleviating her symptoms, but she did not want to take the steroids and the symptoms resumed. She took this for two days. Additionally, the mucinex was "messing with" her appetite, and states that it hurt her stomach.  She denies taking the doxycycline during this time.  Denies severe SOB, LE swelling, chest pain.  Past Medical History:  Diagnosis Date   Amplified musculoskeletal pain    Anemia    Anxiety    Arthralgia    Fever, arthralgias,rash,abd pain, darrhiea, oral ulcers, and fatigue.   Asthma    Asthma    Autism    Behcet's syndrome (HCC) 2016   Possible   Bipolar 2 disorder (HCC)    per patient diagnosis found out to not be bipolar   Depression    Dysmenorrhea in adolescent    Eczema    History of proteinuria syndrome 2012   + hx of hematuria: nephrol w/u in [place] and [place] both normal.   History of vitamin D deficiency    Major depressive disorder, single episode, severe with psychotic features (HCC) 12/22/2015    Migraine with aura    Mouth ulcers    some vaginal also   Patellar subluxation, left, sequela 08/08/2016   Raynaud's phenomenon    Septic arthritis of hip (HCC) 2010     Social History   Tobacco Use   Smoking status: Never   Smokeless tobacco: Never  Vaping Use   Vaping Use: Never used  Substance Use Topics   Alcohol use: No    Alcohol/week: 0.0 standard drinks   Drug use: No    Past Surgical History:  Procedure Laterality Date   APPENDECTOMY  2010   MRI L/S spine  2013   Possible bilateral pars defect at L5 without evidence of spondylolisthesis.  Otherwise normal.    Family History  Problem Relation Age of Onset   Cancer Mother    Cervical cancer Mother    Endometriosis Mother    Bipolar disorder Mother    Anxiety disorder Mother    Anxiety disorder Father    Anxiety disorder Maternal Aunt    Depression Maternal Aunt    Thyroid disease Maternal Grandmother    Bipolar disorder Maternal Grandmother    Depression Maternal Grandmother    Prostate cancer Maternal Grandfather    Diabetes Maternal Grandfather    Heart disease Maternal Grandfather    Heart disease Paternal Grandmother    Rheumatic fever Paternal Grandmother    Stroke Paternal Grandmother    Depression Paternal Grandmother    Mental illness Paternal Grandmother  either bipolar or schizophrenia   Suicidality Paternal Grandmother        attempted   Anxiety disorder Cousin    Depression Cousin    Suicidality Cousin    Suicidality Other    Suicidality Other     Allergies  Allergen Reactions   Mushroom Extract Complex Hives and Rash   Cephalosporins Hives and Rash    Current Medications:   Current Outpatient Medications:    albuterol (PROVENTIL) (2.5 MG/3ML) 0.083% nebulizer solution, Take 3 mLs (2.5 mg total) by nebulization every 6 (six) hours as needed for wheezing or shortness of breath., Disp: 150 mL, Rfl: 1   colchicine 0.6 MG tablet, Take 0.6 mg by mouth 2 (two) times daily. ,  Disp: , Rfl:    hydrOXYzine (ATARAX/VISTARIL) 10 MG tablet, TAKE 1 TABLET BY MOUTH UP TO THREE TIMES DAILY. MAY TAKE AN ADDITIONAL 2 TABLETS IN THE EVENING, Disp: 150 tablet, Rfl: 0   predniSONE (DELTASONE) 50 MG tablet, Take 1 tablet daily, Disp: 5 tablet, Rfl: 0   Adalimumab (HUMIRA PEN) 40 MG/0.4ML PNKT, Inject 40 mg into the skin once a week. Starting on day 29. (Patient not taking: No sig reported), Disp: 2 each, Rfl: 4   Adalimumab (HUMIRA PEN-CD/UC/HS STARTER) 80 MG/0.8ML PNKT, Inject 160 mg into the skin as directed. Inject 160 mg subcutaneous on day 1, then 80 mg subcutaneous on day 15. (Patient not taking: No sig reported), Disp: 1 each, Rfl: 0   benzonatate (TESSALON PERLES) 100 MG capsule, Take 1 capsule (100 mg total) by mouth 3 (three) times daily as needed. (Patient not taking: Reported on 01/01/2021), Disp: 20 capsule, Rfl: 0   doxycycline (VIBRA-TABS) 100 MG tablet, Take 1 tablet (100 mg total) by mouth 2 (two) times daily. (Patient not taking: Reported on 01/01/2021), Disp: 20 tablet, Rfl: 0   Review of Systems:   Review of Systems  All other systems reviewed and are negative. Negative unless otherwise specified per HPI.  Vitals:   Vitals:   01/01/21 0931  BP: 102/60  Pulse: 72  Temp: 98.1 F (36.7 C)  TempSrc: Temporal  SpO2: 99%  Weight: 222 lb (100.7 kg)  Height: 5\' 3"  (1.6 m)     Body mass index is 39.33 kg/m.  Physical Exam:   Physical Exam Vitals and nursing note reviewed.  Constitutional:      General: She is not in acute distress.    Appearance: She is well-developed. She is not ill-appearing or toxic-appearing.  HENT:     Head: Normocephalic and atraumatic.     Right Ear: Tympanic membrane, ear canal and external ear normal. Tympanic membrane is not erythematous, retracted or bulging.     Left Ear: Tympanic membrane, ear canal and external ear normal. Tympanic membrane is not erythematous, retracted or bulging.     Nose: Nose normal.     Right Sinus:  No maxillary sinus tenderness or frontal sinus tenderness.     Left Sinus: No maxillary sinus tenderness or frontal sinus tenderness.     Mouth/Throat:     Pharynx: Uvula midline. No posterior oropharyngeal erythema.  Eyes:     General: Lids are normal.     Conjunctiva/sclera: Conjunctivae normal.  Neck:     Trachea: Trachea normal.  Cardiovascular:     Rate and Rhythm: Normal rate and regular rhythm.     Heart sounds: Normal heart sounds, S1 normal and S2 normal.  Pulmonary:     Effort: Pulmonary effort is normal.  Breath sounds: Normal breath sounds. No decreased breath sounds, wheezing, rhonchi or rales.  Lymphadenopathy:     Cervical: No cervical adenopathy.  Skin:    General: Skin is warm and dry.  Neurological:     Mental Status: She is alert.  Psychiatric:        Speech: Speech normal.        Behavior: Behavior normal. Behavior is cooperative.    Assessment and Plan:   1. Post-viral cough syndrome No red flags on exam Start oral prednisone 50 mg daily x 5 days Restart mucinex and drink plenty of water Nebulizer refill sent If worsening, start doxycyline (already sent in from Dr. Selena Batten) Follow-up if lack of improvement or other concerns.  I,Jordan Kelly,acting as a Neurosurgeon for [employer], PA.,have documented all relevant documentation on the behalf of Jarold Motto, PA,as directed by  Jarold Motto, PA while in the presence of Jarold Motto, [place].  I, Jarold Motto, [place], have reviewed all documentation for this visit. The documentation on 01/01/21 for the exam, diagnosis, procedures, and orders are all accurate and complete.   Jarold Motto, PA-C

## 2021-03-09 ENCOUNTER — Other Ambulatory Visit: Payer: Self-pay

## 2021-03-09 ENCOUNTER — Ambulatory Visit: Payer: Managed Care, Other (non HMO) | Admitting: Physician Assistant

## 2021-03-09 ENCOUNTER — Encounter: Payer: Self-pay | Admitting: Physician Assistant

## 2021-03-09 VITALS — BP 110/70 | HR 72 | Temp 98.0°F | Ht 63.0 in | Wt 235.0 lb

## 2021-03-09 DIAGNOSIS — G8929 Other chronic pain: Secondary | ICD-10-CM

## 2021-03-09 DIAGNOSIS — F410 Panic disorder [episodic paroxysmal anxiety] without agoraphobia: Secondary | ICD-10-CM

## 2021-03-09 DIAGNOSIS — J4540 Moderate persistent asthma, uncomplicated: Secondary | ICD-10-CM

## 2021-03-09 DIAGNOSIS — M546 Pain in thoracic spine: Secondary | ICD-10-CM | POA: Diagnosis not present

## 2021-03-09 MED ORDER — PREDNISONE 50 MG PO TABS
ORAL_TABLET | ORAL | 0 refills | Status: DC
Start: 2021-03-09 — End: 2021-04-15

## 2021-03-09 MED ORDER — QVAR REDIHALER 80 MCG/ACT IN AERB: 1.0000 | INHALATION_SPRAY | Freq: Two times a day (BID) | RESPIRATORY_TRACT | 2 refills | Status: DC

## 2021-03-09 MED ORDER — HYDROXYZINE PAMOATE 25 MG PO CAPS
25.0000 mg | ORAL_CAPSULE | Freq: Three times a day (TID) | ORAL | 0 refills | Status: DC | PRN
Start: 2021-03-09 — End: 2022-02-08

## 2021-03-09 MED ORDER — METHYLPREDNISOLONE ACETATE 80 MG/ML IJ SUSP
80.0000 mg | Freq: Once | INTRAMUSCULAR | Status: AC
  Administered 2021-03-09: 80 mg via INTRAMUSCULAR

## 2021-03-09 MED ORDER — DOXYCYCLINE HYCLATE 100 MG PO TABS: 100.0000 mg | ORAL_TABLET | Freq: Two times a day (BID) | ORAL | 0 refills | Status: DC

## 2021-03-09 MED ORDER — ALBUTEROL SULFATE HFA 108 (90 BASE) MCG/ACT IN AERS
2.0000 | INHALATION_SPRAY | Freq: Four times a day (QID) | RESPIRATORY_TRACT | 2 refills | Status: DC | PRN
Start: 2021-03-09 — End: 2021-11-15

## 2021-03-09 NOTE — Patient Instructions (Signed)
It was great to see you!  QVAR inhaler sent in -- if too expensive, let me know Use this in AM and PM Use albuterol inhaler in between  Pulmonary referral sent in -- if you do not hear anything in a week or so, give them a call: [phone]  Doxycycline antibiotic and oral prednisone pocket prescriptions have been sent  ENJOY YOUR TRIP!!!!  Take care,  Jarold Motto PA-C

## 2021-03-09 NOTE — Progress Notes (Signed)
Molly Molina is a 19 y.o. female here for asthma.  History of Present Illness:   Chief Complaint  Patient presents with   Asthma    Seems like onset has come after having Covid   Back Pain    Back feels heavy when breathing Possibly get an inhaler    Molly Molina presented to today's visit with her mother, Molly Molina.  HPI  Asthma Molly Molina presents with concerns that her asthma has worsened since having Covid this past September. Has hx of childhood asthma that she "grew out of" that only required prn albuterol inhaler. She currently describes her sx as not breathing as well or going into a coughing spell after laughing. Reports she has needed to sleep with her humidifier every night or she'll wake up in the middle of the night with chest tightness and hearing a whistle noise upon breathing.   At this time she states she is using her proventil 15mLs nebulizer a couple of times a day despite not liking that it makes her feel shaky. This has  provided her with relief, but she feels she could be using it more. Molly Molina is interested in receiving a steroid shot due to her current flair up from Behcet's and upcoming trip.    Back Pain In addition to her worsening asthma, she has been having intermittent upper back pain that she believes could be related to her asthma or Behcet's syndrome. States upon walking at times, she feels there is cement on her back. She has also felt increased fatigue with this back pain. Denies urinary symptoms.   Travel Molly Molina is going on a trip with her family soon and will be flying. According to her mother, she experiences increased anxiety upon flying. At this time Molly Molina is interested in receiving something to keep her calm in the event she starts to panic while traveling.   Past Medical History:  Diagnosis Date   Amplified musculoskeletal pain    Anemia    Anxiety    Arthralgia    Fever, arthralgias,rash,abd pain, darrhiea, oral ulcers, and fatigue.    Asthma    Asthma    Autism    Behcet's syndrome ([place]) 2016   Possible   Bipolar 2 disorder ([place])    per patient diagnosis found out to not be bipolar   Depression    Dysmenorrhea in adolescent    Eczema    History of proteinuria syndrome 2012   + hx of hematuria: nephrol w/u in [place] and [place] both normal.   History of vitamin D deficiency    Major depressive disorder, single episode, severe with psychotic features ([place]) 12/22/2015   Migraine with aura    Mouth ulcers    some vaginal also   Patellar subluxation, left, sequela 08/08/2016   Raynaud's phenomenon    Septic arthritis of hip ([place]) 2010     Social History   Tobacco Use   Smoking status: Never   Smokeless tobacco: Never  Vaping Use   Vaping Use: Never used  Substance Use Topics   Alcohol use: No    Alcohol/week: 0.0 standard drinks   Drug use: No    Past Surgical History:  Procedure Laterality Date   APPENDECTOMY  2010   MRI L/S spine  2013   Possible bilateral pars defect at L5 without evidence of spondylolisthesis.  Otherwise normal.    Family History  Problem Relation Age of Onset   Cancer Mother    Cervical cancer Mother  Endometriosis Mother    Bipolar disorder Mother    Anxiety disorder Mother    Anxiety disorder Father    Anxiety disorder Maternal Aunt    Depression Maternal Aunt    Thyroid disease Maternal Grandmother    Bipolar disorder Maternal Grandmother    Depression Maternal Grandmother    Prostate cancer Maternal Grandfather    Diabetes Maternal Grandfather    Heart disease Maternal Grandfather    Heart disease Paternal Grandmother    Rheumatic fever Paternal Grandmother    Stroke Paternal Grandmother    Depression Paternal Grandmother    Mental illness Paternal Grandmother        either bipolar or schizophrenia   Suicidality Paternal Grandmother        attempted   Anxiety disorder Cousin    Depression Cousin    Suicidality Cousin    Suicidality Other    Suicidality  Other     Allergies  Allergen Reactions   Mushroom Extract Complex Hives and Rash   Cephalosporins Hives and Rash    Current Medications:   Current Outpatient Medications:    albuterol (PROVENTIL) (2.5 MG/3ML) 0.083% nebulizer solution, Take 3 mLs (2.5 mg total) by nebulization every 6 (six) hours as needed for wheezing or shortness of breath., Disp: 150 mL, Rfl: 1   hydrOXYzine (ATARAX/VISTARIL) 10 MG tablet, TAKE 1 TABLET BY MOUTH UP TO THREE TIMES DAILY. MAY TAKE AN ADDITIONAL 2 TABLETS IN THE EVENING, Disp: 150 tablet, Rfl: 0   Adalimumab (HUMIRA PEN) 40 MG/0.4ML PNKT, Inject 40 mg into the skin once a week. Starting on day 29. (Patient not taking: Reported on 10/08/2020), Disp: 2 each, Rfl: 4   Adalimumab (HUMIRA PEN-CD/UC/HS STARTER) 80 MG/0.8ML PNKT, Inject 160 mg into the skin as directed. Inject 160 mg subcutaneous on day 1, then 80 mg subcutaneous on day 15. (Patient not taking: Reported on 10/08/2020), Disp: 1 each, Rfl: 0   benzonatate (TESSALON PERLES) 100 MG capsule, Take 1 capsule (100 mg total) by mouth 3 (three) times daily as needed. (Patient not taking: Reported on 01/01/2021), Disp: 20 capsule, Rfl: 0   colchicine 0.6 MG tablet, Take 0.6 mg by mouth 2 (two) times daily. , Disp: , Rfl:    doxycycline (VIBRA-TABS) 100 MG tablet, Take 1 tablet (100 mg total) by mouth 2 (two) times daily. (Patient not taking: Reported on 01/01/2021), Disp: 20 tablet, Rfl: 0   predniSONE (DELTASONE) 50 MG tablet, Take 1 tablet daily (Patient not taking: Reported on 03/09/2021), Disp: 5 tablet, Rfl: 0   Review of Systems:   ROS Negative unless otherwise specified per HPI. Vitals:   Vitals:   03/09/21 1104  BP: 110/70  Pulse: 72  Temp: 98 F (36.7 C)  TempSrc: Temporal  SpO2: 98%  Weight: 235 lb (106.6 kg)  Height: 5\' 3"  (1.6 m)     Body mass index is 41.63 kg/m.  Physical Exam:   Physical Exam Vitals and nursing note reviewed.  Constitutional:      General: She is not in acute  distress.    Appearance: She is well-developed. She is not ill-appearing or toxic-appearing.  Cardiovascular:     Rate and Rhythm: Normal rate and regular rhythm.     Pulses: Normal pulses.     Heart sounds: Normal heart sounds, S1 normal and S2 normal.  Pulmonary:     Effort: Pulmonary effort is normal.     Breath sounds: Normal breath sounds.  Musculoskeletal:     Comments: No decreased ROM  2/2 pain with flexion/extension, lateral side bends, or rotation. No reproducible tenderness with deep palpation to bilateral paraspinal muscles. No bony tenderness. No evidence of erythema, rash or ecchymosis.   Skin:    General: Skin is warm and dry.  Neurological:     Mental Status: She is alert.     GCS: GCS eye subscore is 4. GCS verbal subscore is 5. GCS motor subscore is 6.  Psychiatric:        Speech: Speech normal.        Behavior: Behavior normal. Behavior is cooperative.    Assessment and Plan:   Moderate persistent reactive airway disease without complication Uncontrolled Depomedrol 80 mg injection given today -- tolerated well  Start Qvar Redihaler 80 mcg twice daily, AM and PM--if too expensive please let me know Use albuterol inhaler 108 base mcg every 6 hours as needed  Safety rx for her trip given for  prednisone 50 mg, one tablet for 5 days and doxycycline 100 mg twice daily x 10 days -- with verbal discussion about indications of when to take these medications Sent in referral to pulmonology and provided information in case patient does not hear from them within a week Follow up as needed   Anxiety Refill hydroxyzine 25 mg every 8 hours as needed for anxiety  Chronic thoracic back pain No red flags Recommend ongoing management with supportive care Denies further work-up at this time Worsening precautions advised  I,Molly Molina,acting as a scribe for [employer], PA.,have documented all relevant documentation on the behalf of Inda Coke, PA,as directed  by  Inda Coke, PA while in the presence of Inda Coke, [place].  I, Inda Coke, [place], have reviewed all documentation for this visit. The documentation on 03/09/21 for the exam, diagnosis, procedures, and orders are all accurate and complete.   Inda Coke, PA-C

## 2021-04-13 ENCOUNTER — Other Ambulatory Visit: Payer: Self-pay | Admitting: *Deleted

## 2021-04-13 MED ORDER — FLUTICASONE PROPIONATE HFA 44 MCG/ACT IN AERO: 1.0000 | INHALATION_SPRAY | Freq: Two times a day (BID) | RESPIRATORY_TRACT | 12 refills | Status: DC

## 2021-04-14 ENCOUNTER — Encounter: Payer: Self-pay | Admitting: Physician Assistant

## 2021-04-14 NOTE — Progress Notes (Signed)
Subjective:    Molly Molina is a 21 y.o. female and is here for a comprehensive physical exam.  HPI  Health Maintenance Due  Topic Date Due   Pneumococcal Vaccine 52-16 Years old (1 - PCV) Never done   HPV VACCINES (1 - 2-dose series) Never done   HIV Screening  Never done   Hepatitis C Screening  Never done   TETANUS/TDAP  Never done    Acute Concerns: None  Chronic Issues: Anxiety Molly Molina is compliant with taking hydroxyzine 25 mg every eight hours as needed with no adverse effects. Previously she was taking this medication about once a week, mainly during times she felt an increase in anxiety or needed help sleeping. At this time, she is taking OTC olly sleep gummies which she has found very beneficial for sleep instead.   Currently she is participating in virtual talk therapy and has found this to be very beneficial. At this time she has plans to continue participating in this and using tools to help with her mood. Denies SI/HI.  Asthma Following our previous visit, pt states she has been compliant with using Qvar redihaler 80 mcg twice daily, in AM and PM, and albuterol inhaler 108 mcg prn with no complications. Her insurance has changed and the Qvar is no longer covered -- she is in need of an alternative.  Behcet's Syndrome  At this time, Molly Molina states that she has recent flare-ups including skin break outs, intestinal inflammation, and joint inflammation. Reports that she believes flare-ups are connected to her diet which has led her to try and adjust her diet. At this time, she is coming to terms with having to cut her current diet by 80%, mainly in taking fruits, vegetables, and meat. Currently she is compliant with colchicine 0.6 mg twice daily with no adverse effects. She is set to start humira injections which she believes will provide additional relief. Despite mild flare-ups she is managing well.   Hidradenitis Suppurativa Molly Molina states that she hasn't  followed up with Robyne Askew, dermatology about this issue in a while, but plans on starting humira after receiving the okay from Dr. Ernestine Conrad, Rheumatology. She is looking forward to starting this medication. Denies concerning sx.   Hx of Proteinuria She would like her urine studies updated to see if there is any protein. She is asymptomatic.  Health Maintenance: Immunizations -- Covid- UTD Influenza- Due;2020 Tdap- Due PAP -- N/A Bone Density -- N/A Diet -- Eats all food groups, working to cut out Magazine features editor- UTD Ophthalmology-  Sleep habits -- Normal schedule Exercise -- Not currently  Current Weight -- Stable   Weight History: Wt Readings from Last 10 Encounters:  03/09/21 235 lb (106.6 kg)  01/01/21 222 lb (100.7 kg) (99 %, Z= 2.23)*  10/08/20 227 lb 8 oz (103.2 kg) (99 %, Z= 2.29)*  06/20/20 220 lb (99.8 kg) (99 %, Z= 2.21)*  04/29/20 217 lb 6.1 oz (98.6 kg) (99 %, Z= 2.18)*  03/18/20 212 lb 8 oz (96.4 kg) (98 %, Z= 2.12)*  01/17/20 207 lb (93.9 kg) (98 %, Z= 2.05)*  12/25/19 207 lb (93.9 kg) (98 %, Z= 2.05)*  11/26/19 215 lb (97.5 kg) (98 %, Z= 2.15)*  08/30/19 218 lb (98.9 kg) (99 %, Z= 2.18)*   * Growth percentiles are based on CDC (Girls, 2-20 Years) data.   There is no height or weight on file to calculate BMI. Mood -- Stable  No LMP recorded. Period characteristics --  Normal  Birth control -- None    reports no history of alcohol use.  Tobacco Use: Low Risk    Smoking Tobacco Use: Never   Smokeless Tobacco Use: Never   Passive Exposure: Not on file     Depression screen [facility] 2/9 01/12/2016  Decreased Interest 0  Down, Depressed, Hopeless 0  PHQ - 2 Score 0  Altered sleeping -  Tired, decreased energy -  Change in appetite -  Feeling bad or failure about yourself  -  Trouble concentrating -  Moving slowly or fidgety/restless -  Suicidal thoughts -  PHQ-9 Score -  Some encounter information is confidential and restricted. Go to  Review Flowsheets activity to see all data.     Other providers/specialists: Patient Care Team: Inda Coke, [place] as PCP - General (Physician Assistant) Pat Patrick, MD as Consulting Physician (Pediatric Pulmonology) Winifred Olive, MD as Consulting Physician (Pediatric Pulmonology) Annabelle Harman, MD as Consulting Physician (Pediatric Rheumatology) [place] as Consulting Physician (Ophthalmology)   PMHx, SurgHx, SocialHx, Medications, and Allergies were reviewed in the Visit Navigator and updated as appropriate.   Past Medical History:  Diagnosis Date   Amplified musculoskeletal pain    Anemia    Anxiety    Arthralgia    Fever, arthralgias,rash,abd pain, darrhiea, oral ulcers, and fatigue.   Asthma    Asthma    Autism    Behcet's syndrome ([place]) 2016   Possible   Bipolar 2 disorder ([place])    per patient diagnosis found out to not be bipolar   Depression    Dysmenorrhea in adolescent    Eczema    History of proteinuria syndrome 2012   + hx of hematuria: nephrol w/u in [place] and [place] both normal.   History of vitamin D deficiency    Major depressive disorder, single episode, severe with psychotic features ([place]) 12/22/2015   Migraine with aura    Mouth ulcers    some vaginal also   Patellar subluxation, left, sequela 08/08/2016   Raynaud's phenomenon    Septic arthritis of hip ([place]) 2010     Past Surgical History:  Procedure Laterality Date   APPENDECTOMY  2010   MRI L/S spine  2013   Possible bilateral pars defect at L5 without evidence of spondylolisthesis.  Otherwise normal.     Family History  Problem Relation Age of Onset   Cancer Mother    Cervical cancer Mother    Endometriosis Mother    Bipolar disorder Mother    Anxiety disorder Mother    Anxiety disorder Father    Anxiety disorder Maternal Aunt    Depression Maternal Aunt    Thyroid disease Maternal Grandmother    Bipolar disorder Maternal Grandmother     Depression Maternal Grandmother    Prostate cancer Maternal Grandfather    Diabetes Maternal Grandfather    Heart disease Maternal Grandfather    Heart disease Paternal Grandmother    Rheumatic fever Paternal Grandmother    Stroke Paternal Grandmother    Depression Paternal Grandmother    Mental illness Paternal Grandmother        either bipolar or schizophrenia   Suicidality Paternal Grandmother        attempted   Anxiety disorder Cousin    Depression Cousin    Suicidality Cousin    Suicidality Other    Suicidality Other     Social History   Tobacco Use   Smoking status: Never   Smokeless tobacco: Never  Vaping Use   Vaping Use: Never used  Substance Use Topics   Alcohol use: No    Alcohol/week: 0.0 standard drinks   Drug use: No    Review of Systems:   Review of Systems  Constitutional:  Negative for chills, fever, malaise/fatigue and weight loss.  HENT:  Negative for hearing loss, sinus pain and sore throat.   Respiratory:  Negative for cough and hemoptysis.   Cardiovascular:  Negative for chest pain, palpitations, leg swelling and PND.  Gastrointestinal:  Negative for abdominal pain, constipation, diarrhea, heartburn, nausea and vomiting.  Genitourinary:  Negative for dysuria, frequency and urgency.  Musculoskeletal:  Negative for back pain, myalgias and neck pain.  Skin:  Negative for itching and rash.  Neurological:  Negative for dizziness, tingling, seizures and headaches.  Endo/Heme/Allergies:  Negative for polydipsia.  Psychiatric/Behavioral:  Negative for depression. The patient is not nervous/anxious.      Objective:   There were no vitals taken for this visit.  General Appearance:    Alert, cooperative, no distress, appears stated age  Head:    Normocephalic, without obvious abnormality, atraumatic  Eyes:    PERRL, conjunctiva/corneas clear, EOM's intact, fundi    benign, both eyes  Ears:    Normal TM's and external ear canals, both ears  Nose:    Nares normal, septum midline, mucosa normal, no drainage    or sinus tenderness  Throat:   Lips, mucosa, and tongue normal; teeth and gums normal  Neck:   Supple, symmetrical, trachea midline, no adenopathy;    thyroid:  no enlargement/tenderness/nodules; no carotid   bruit or JVD  Back:     Symmetric, no curvature, ROM normal, no CVA tenderness  Lungs:     Clear to auscultation bilaterally, respirations unlabored  Chest Wall:    No tenderness or deformity   Heart:    Regular rate and rhythm, S1 and S2 normal, no murmur, rub   or gallop  Breast Exam:    Deferred  Abdomen:     Soft, non-tender, bowel sounds active all four quadrants,    no masses, no organomegaly  Genitalia:   Deferred  Rectal:   Deferred  Extremities:   Extremities normal, atraumatic, no cyanosis or edema  Pulses:   2+ and symmetric all extremities  Skin:   Skin color, texture, turgor normal, no rashes or lesions  Lymph nodes:   Cervical, supraclavicular, and axillary nodes normal  Neurologic:   CNII-XII intact, normal strength, sensation and reflexes    throughout    Assessment/Plan:   Routine physical examination Today patient counseled on age appropriate routine health concerns for screening and prevention, each reviewed and up to date or declined. Immunizations reviewed and up to date or declined. Labs ordered and reviewed. Risk factors for depression reviewed and negative. Hearing function and visual acuity are intact. ADLs screened and addressed as needed. Functional ability and level of safety reviewed and appropriate. Education, counseling and referrals performed based on assessed risks today. Patient provided with a copy of personalized plan for preventive services.   Moderate persistent reactive airway disease without complication Stable Continue use of albuterol 108 mcg base inhaler as needed and Qvar Redihaler 80 mcg twice daily  Patient will reach out to let us know which steroid-inhaler is covered by new  insurance policy Provided patient with pulmonology contact information for further evaluation Follow up as needed   Severe anxiety with panic Stable Continue Hydroxyzine 25 mg every 8 hours as needed Patient denies  SI/HI Encouraged patient to continue participating in talk therapy  I advised patient that if they develop any SI, to tell someone immediately and seek medical attention  Behcet's syndrome ([place]) Continue colchicine 0.6 mg twice daily  Stable management per specialist   Encounter for screening for other viral diseases  - Hepatitis C Antibody  Screening for HIV (human immunodeficiency virus) Completed today  Encounter for lipid screening for cardiovascular disease Update labs today, will start medication as indicated   - Lipid Panel  Obesity, unspecified classification, unspecified obesity type, unspecified whether serious comorbidity present Encouraged patient to practice making healthier dietary choices and increasing daily exercise  History of proteinuria syndrome Update labs today, will make recommendations accordingly  - Urinalysis, Routine w Reflex microscopic   Hidradenitis Suppurativa Management per derm; starting Humira soon   Patient Counseling:   [x]     Nutrition: Stressed importance of moderation in sodium/caffeine intake, saturated fat and cholesterol, caloric balance, sufficient intake of fresh fruits, vegetables, fiber, calcium, iron, and 1 mg of folate supplement per day (for females capable of pregnancy).   [x]      Stressed the importance of regular exercise.    [x]     Substance Abuse: Discussed cessation/primary prevention of tobacco, alcohol, or other drug use; driving or other dangerous activities under the influence; availability of treatment for abuse.    [x]      Injury prevention: Discussed safety belts, safety helmets, smoke detector, smoking near bedding or upholstery.    [x]      Sexuality: Discussed sexually transmitted  diseases, partner selection, use of condoms, avoidance of unintended pregnancy  and contraceptive alternatives.    [x]     Dental health: Discussed importance of regular tooth brushing, flossing, and dental visits.   [x]      Health maintenance and immunizations reviewed. Please refer to Health maintenance section.   I,Havlyn C Ratchford,acting as a Education administrator for [employer], PA.,have documented all relevant documentation on the behalf of Inda Coke, PA,as directed by  Inda Coke, PA while in the presence of Inda Coke, [place].  I, Inda Coke, [place], have reviewed all documentation for this visit. The documentation on 04/15/21 for the exam, diagnosis, procedures, and orders are all accurate and complete.   Inda Coke, PA-C [place]

## 2021-04-15 ENCOUNTER — Ambulatory Visit (INDEPENDENT_AMBULATORY_CARE_PROVIDER_SITE_OTHER): Payer: Managed Care, Other (non HMO) | Admitting: Physician Assistant

## 2021-04-15 ENCOUNTER — Encounter: Payer: Self-pay | Admitting: Physician Assistant

## 2021-04-15 ENCOUNTER — Other Ambulatory Visit: Payer: Self-pay

## 2021-04-15 VITALS — BP 110/70 | HR 84 | Temp 97.6°F | Ht 63.0 in | Wt 234.4 lb

## 2021-04-15 DIAGNOSIS — M352 Behcet's disease: Secondary | ICD-10-CM | POA: Diagnosis not present

## 2021-04-15 DIAGNOSIS — E669 Obesity, unspecified: Secondary | ICD-10-CM

## 2021-04-15 DIAGNOSIS — F410 Panic disorder [episodic paroxysmal anxiety] without agoraphobia: Secondary | ICD-10-CM

## 2021-04-15 DIAGNOSIS — Z114 Encounter for screening for human immunodeficiency virus [HIV]: Secondary | ICD-10-CM

## 2021-04-15 DIAGNOSIS — Z13220 Encounter for screening for lipoid disorders: Secondary | ICD-10-CM

## 2021-04-15 DIAGNOSIS — Z87448 Personal history of other diseases of urinary system: Secondary | ICD-10-CM | POA: Diagnosis not present

## 2021-04-15 DIAGNOSIS — L732 Hidradenitis suppurativa: Secondary | ICD-10-CM

## 2021-04-15 DIAGNOSIS — J4540 Moderate persistent asthma, uncomplicated: Secondary | ICD-10-CM

## 2021-04-15 DIAGNOSIS — Z0000 Encounter for general adult medical examination without abnormal findings: Secondary | ICD-10-CM

## 2021-04-15 DIAGNOSIS — Z1159 Encounter for screening for other viral diseases: Secondary | ICD-10-CM

## 2021-04-15 DIAGNOSIS — Z136 Encounter for screening for cardiovascular disorders: Secondary | ICD-10-CM

## 2021-04-15 LAB — CBC WITH DIFFERENTIAL/PLATELET
Basophils Absolute: 0 10*3/uL (ref 0.0–0.1)
Basophils Relative: 0.9 % (ref 0.0–3.0)
Eosinophils Absolute: 0.3 10*3/uL (ref 0.0–0.7)
Eosinophils Relative: 5.5 % — ABNORMAL HIGH (ref 0.0–5.0)
HCT: 38.8 % (ref 36.0–46.0)
Hemoglobin: 12.5 g/dL (ref 12.0–15.0)
Lymphocytes Relative: 33.2 % (ref 12.0–46.0)
Lymphs Abs: 1.6 10*3/uL (ref 0.7–4.0)
MCHC: 32.3 g/dL (ref 30.0–36.0)
MCV: 79.4 fl (ref 78.0–100.0)
Monocytes Absolute: 0.4 10*3/uL (ref 0.1–1.0)
Monocytes Relative: 9.1 % (ref 3.0–12.0)
Neutro Abs: 2.5 10*3/uL (ref 1.4–7.7)
Neutrophils Relative %: 51.3 % (ref 43.0–77.0)
Platelets: 250 10*3/uL (ref 150.0–400.0)
RBC: 4.89 Mil/uL (ref 3.87–5.11)
RDW: 13.5 % (ref 11.5–14.6)
WBC: 4.8 10*3/uL (ref 4.5–10.5)

## 2021-04-15 LAB — COMPREHENSIVE METABOLIC PANEL
ALT: 11 U/L (ref 0–35)
AST: 17 U/L (ref 0–37)
Albumin: 4.5 g/dL (ref 3.5–5.2)
Alkaline Phosphatase: 48 U/L (ref 39–117)
BUN: 16 mg/dL (ref 6–23)
CO2: 29 mEq/L (ref 19–32)
Calcium: 9.5 mg/dL (ref 8.4–10.5)
Chloride: 103 mEq/L (ref 96–112)
Creatinine, Ser: 0.74 mg/dL (ref 0.40–1.20)
GFR: 116.6 mL/min (ref >60.00)
Glucose, Bld: 71 mg/dL (ref 70–99)
Potassium: 3.8 mEq/L (ref 3.5–5.1)
Sodium: 139 mEq/L (ref 135–145)
Total Bilirubin: 0.3 mg/dL (ref 0.2–1.2)
Total Protein: 7.7 g/dL (ref 6.0–8.3)

## 2021-04-15 LAB — LIPID PANEL
Cholesterol: 170 mg/dL (ref 0–200)
HDL: 56.6 mg/dL (ref >39.00)
LDL Cholesterol: 104 mg/dL — ABNORMAL HIGH (ref 0–99)
NonHDL: 112.92
Total CHOL/HDL Ratio: 3
Triglycerides: 46 mg/dL (ref 0.0–149.0)
VLDL: 9.2 mg/dL (ref 0.0–40.0)

## 2021-04-15 LAB — HEPATITIS C ANTIBODY
Hepatitis C Ab: NONREACTIVE
SIGNAL TO CUT-OFF: 0.18 (ref <1.00)

## 2021-04-15 LAB — HIV ANTIBODY (ROUTINE TESTING W REFLEX): HIV 1&2 Ab, 4th Generation: NONREACTIVE

## 2021-04-15 LAB — HEMOGLOBIN A1C: Hgb A1c MFr Bld: 5 % (ref 4.6–6.5)

## 2021-04-15 NOTE — Patient Instructions (Addendum)
It was great to see you!  Here is the number for [facility] -->  [phone]  Please let me know what steroid-inhaler is covered with your insurance and let us know which one you'd like [place] to prescribe  Please go to the lab for blood work.   Our office will call you with your results unless you have chosen to receive results via MyChart.  If your blood work is normal we will follow-up each year for physicals and as scheduled for chronic medical problems.  If anything is abnormal we will treat accordingly and get you in for a follow-up.  Take care,  Lelon Mast

## 2021-04-16 LAB — URINALYSIS, ROUTINE W REFLEX MICROSCOPIC
Bilirubin Urine: NEGATIVE
Hgb urine dipstick: NEGATIVE
Ketones, ur: NEGATIVE
Leukocytes,Ua: NEGATIVE
Nitrite: NEGATIVE
Specific Gravity, Urine: 1.02 (ref 1.000–1.030)
Total Protein, Urine: NEGATIVE
Urine Glucose: NEGATIVE
Urobilinogen, UA: 0.2 (ref 0.0–1.0)
pH: 6.5 (ref 5.0–8.0)

## 2021-05-25 ENCOUNTER — Encounter: Payer: Self-pay | Admitting: Physician Assistant

## 2021-05-25 ENCOUNTER — Other Ambulatory Visit: Payer: Self-pay | Admitting: Physician Assistant

## 2021-05-25 DIAGNOSIS — E559 Vitamin D deficiency, unspecified: Secondary | ICD-10-CM

## 2021-06-13 ENCOUNTER — Emergency Department (HOSPITAL_BASED_OUTPATIENT_CLINIC_OR_DEPARTMENT_OTHER): Payer: Managed Care, Other (non HMO) | Admitting: Radiology

## 2021-06-13 ENCOUNTER — Encounter (HOSPITAL_BASED_OUTPATIENT_CLINIC_OR_DEPARTMENT_OTHER): Payer: Self-pay | Admitting: Obstetrics and Gynecology

## 2021-06-13 ENCOUNTER — Other Ambulatory Visit: Payer: Self-pay

## 2021-06-13 ENCOUNTER — Emergency Department (HOSPITAL_BASED_OUTPATIENT_CLINIC_OR_DEPARTMENT_OTHER)
Admission: EM | Admit: 2021-06-13 | Discharge: 2021-06-13 | Disposition: A | Discharge status: Home / Self Care | Payer: Managed Care, Other (non HMO) | Attending: Emergency Medicine | Admitting: Emergency Medicine

## 2021-06-13 DIAGNOSIS — J019 Acute sinusitis, unspecified: Secondary | ICD-10-CM | POA: Diagnosis not present

## 2021-06-13 DIAGNOSIS — R5383 Other fatigue: Secondary | ICD-10-CM | POA: Diagnosis present

## 2021-06-13 DIAGNOSIS — Z20822 Contact with and (suspected) exposure to covid-19: Secondary | ICD-10-CM | POA: Insufficient documentation

## 2021-06-13 DIAGNOSIS — Z79899 Other long term (current) drug therapy: Secondary | ICD-10-CM | POA: Insufficient documentation

## 2021-06-13 LAB — RESP PANEL BY RT-PCR (FLU A&B, COVID) ARPGX2
Influenza A by PCR: NEGATIVE
Influenza B by PCR: NEGATIVE
SARS Coronavirus 2 by RT PCR: NEGATIVE

## 2021-06-13 LAB — GROUP A STREP BY PCR: Group A Strep by PCR: NOT DETECTED

## 2021-06-13 MED ORDER — ACETAMINOPHEN 325 MG PO TABS
650.0000 mg | ORAL_TABLET | Freq: Once | ORAL | Status: AC
  Administered 2021-06-13: 650 mg via ORAL
  Filled 2021-06-13: qty 2

## 2021-06-13 MED ORDER — DEXAMETHASONE 4 MG PO TABS
10.0000 mg | ORAL_TABLET | Freq: Once | ORAL | Status: AC
  Administered 2021-06-13: 10 mg via ORAL
  Filled 2021-06-13: qty 3

## 2021-06-13 MED ORDER — KETOROLAC TROMETHAMINE 15 MG/ML IJ SOLN
15.0000 mg | Freq: Once | INTRAMUSCULAR | Status: AC
  Administered 2021-06-13: 15 mg via INTRAMUSCULAR
  Filled 2021-06-13: qty 1

## 2021-06-13 MED ORDER — AMOXICILLIN-POT CLAVULANATE 875-125 MG PO TABS: 1.0000 | ORAL_TABLET | Freq: Two times a day (BID) | ORAL | 0 refills | Status: AC

## 2021-06-13 NOTE — Discharge Instructions (Addendum)
Take 600mg  ibuprofen every 6 hours for pain.  Chloraseptic spray can be useful for the management of sore throat.  You may also benefit from changing your Zyrtec to Zyrtec-D 1-2 times per day.  This can be found behind the counter at your local pharmacy, but does not require a prescription.  Continue with saline sinus rinses such as the use of a Nettie pot or saline nasal sprays.  You may also try over-the-counter nasal sprays such as Xyzal or Flonase for congestion management.  If none of the supportive measures are helpful, start Augmentin and take as prescribed until finished.  Follow up with your primary care doctor. ?

## 2021-06-13 NOTE — ED Triage Notes (Signed)
Patient reports sinus drainage, sore throat, taking robitussin, green tea, and has tried benadryl and robitussin. Patient reports she has a bad headache as well.  ?

## 2021-06-13 NOTE — ED Provider Notes (Cosign Needed)
[facility]ERGENC[facility] DEPT Provider Note   CSN: [id number] Arrival date & time: 06/13/21  1618     History  No chief complaint on file.   Molly Molina is a 21 y.o. female.  The history is provided by the patient. No language interpreter was used.  URI Presenting symptoms: congestion, fatigue and sore throat   Presenting symptoms: no fever   Congestion:    Location:  Nasal   Interferes with sleep: no     Interferes with eating/drinking: no   Sore throat:    Severity:  Moderate   Onset quality:  Gradual   Duration:  2 weeks   Timing:  Constant   Progression:  Worsening Severity:  Moderate Onset quality:  Gradual Duration:  2 weeks Timing:  Constant Progression:  Worsening Relieved by:  Nothing Ineffective treatments: Robitussin, Benadryl. Associated symptoms: sinus pain and wheezing   Associated symptoms: no neck pain   Risk factors: no sick contacts       Home Medications Prior to Admission medications   Medication Sig Start Date End Date Taking? Authorizing Provider  amoxicillin-clavulanate (AUGMENTIN) 875-125 MG tablet Take 1 tablet by mouth every 12 (twelve) hours for 10 days. 06/13/21 06/23/21 Yes Antonietta Breach, PA-C  Adalimumab (HUMIRA PEN) 40 MG/0.4ML PNKT Inject 40 mg into the skin once a week. Starting on day 29. Patient not taking: Reported on 10/08/2020 10/01/20   Lavonna Monarch, MD  Adalimumab [facility] PEN-CD/UC/HS STARTER) 80 MG/0.8ML PNKT Inject 160 mg into the skin as directed. Inject 160 mg subcutaneous on day 1, then 80 mg subcutaneous on day 15. Patient not taking: Reported on 10/08/2020 10/01/20   Lavonna Monarch, MD  albuterol (PROVENTIL) (2.5 MG/3ML) 0.083% nebulizer solution Take 3 mLs (2.5 mg total) by nebulization every 6 (six) hours as needed for wheezing or shortness of breath. 01/01/21   Inda Coke, PA  albuterol (VENTOLIN HFA) 108 (90 Base) MCG/ACT inhaler Inhale 2 puffs into the lungs every 6 (six) hours as needed for wheezing or  shortness of breath. 03/09/21   Inda Coke, PA  beclomethasone (QVAR REDIHALER) 80 MCG/ACT inhaler Inhale 1 puff into the lungs 2 (two) times daily. 03/09/21   Inda Coke, PA  colchicine 0.6 MG tablet Take 0.6 mg by mouth 2 (two) times daily.  08/11/16   [provider]  hydrOXYzine (VISTARIL) 25 MG capsule Take 1 capsule (25 mg total) by mouth every 8 (eight) hours as needed. 03/09/21   Inda Coke, PA      Allergies    Mushroom extract complex and Cephalosporins    Review of Systems   Review of Systems  Constitutional:  Positive for fatigue. Negative for fever.  HENT:  Positive for congestion, postnasal drip, sinus pain and sore throat.   Respiratory:  Positive for wheezing.   Musculoskeletal:  Negative for neck pain.  Ten systems reviewed and are negative for acute change, except as noted in the HPI.    Physical Exam Updated Vital Signs BP 126/83 (BP Location: Right Arm)    Pulse (!) 111    Temp 99 F (37.2 C)    Resp 16    LMP 06/03/2021    SpO2 100%   Physical Exam Vitals and nursing note reviewed.  Constitutional:      General: She is not in acute distress.    Appearance: She is well-developed. She is not diaphoretic.     Comments: Nontoxic-appearing and in no acute distress  HENT:     Head: Normocephalic and atraumatic.  Right Ear: Tympanic membrane, ear canal and external ear normal.     Left Ear: Tympanic membrane, ear canal and external ear normal.     Nose: Congestion present. No rhinorrhea.     Mouth/Throat:     Mouth: Mucous membranes are moist.     Pharynx: No oropharyngeal exudate.     Comments: No posterior oropharyngeal edema.  Uvula midline.  Tolerating secretions without difficulty.  Normal phonation. Eyes:     General: No scleral icterus.    Conjunctiva/sclera: Conjunctivae normal.  Neck:     Comments: No meningismus Cardiovascular:     Rate and Rhythm: Normal rate and regular rhythm.     Pulses: Normal pulses.     Comments:  Not tachycardic as noted in triage Pulmonary:     Effort: Pulmonary effort is normal. No respiratory distress.     Breath sounds: No stridor. No wheezing.     Comments: Respirations even and unlabored Musculoskeletal:        General: Normal range of motion.     Cervical back: Normal range of motion.  Skin:    General: Skin is warm and dry.     Coloration: Skin is not pale.     Findings: No erythema or rash.  Neurological:     Mental Status: She is alert and oriented to person, place, and time.     Coordination: Coordination normal.  Psychiatric:        Behavior: Behavior normal.    ED Results / Procedures / Treatments   Labs (all labs ordered are listed, but only abnormal results are displayed) Labs Reviewed  RESP PANEL BY RT-PCR (FLU A&B, COVID) [id number]  GROUP A STREP BY PCR    EKG None  Radiology DG Chest 2 View  Result Date: 06/13/2021 CLINICAL DATA:  Cough and weakness for 2 weeks. EXAM: CHEST - 2 VIEW COMPARISON:  None. FINDINGS: The heart size and mediastinal contours are within normal limits. Both lungs are clear. The visualized skeletal structures are unremarkable. IMPRESSION: Normal exam. Electronically Signed   By: Marlaine Hind M.D.   On: 06/13/2021 17:15    Procedures Procedures    Medications Ordered in ED Medications  acetaminophen (TYLENOL) tablet 650 mg (650 mg Oral Given 06/13/21 1929)  ketorolac (TORADOL) 15 MG/ML injection 15 mg (15 mg Intramuscular Given 06/13/21 1926)  dexamethasone (DECADRON) tablet 10 mg (10 mg Oral Given 06/13/21 1924)    ED Course/ Medical Decision Making/ A&P Clinical Course as of 06/13/21 1937  Sun Jun 13, 2021  1936 CXR personally visualized. Negative for acute cardiopulmonary abnormality. [KH]  1936 Patient treated with Tylenol, Toradol, Decadron prior to discharge for symptomatic management. [KH]    Clinical Course User Index [KH] Antonietta Breach, PA-C                           Medical Decision Making Amount and/or  Complexity of Data Reviewed Radiology: ordered.  Risk OTC drugs. Prescription drug management.   Patient complaining of symptoms of sinusitis.  Reports clear/yellow nasal discharge/congestion and scratchy throat with cough, PND.  Symptoms have been present for greater than 10 days with purulent nasal discharge and maxillary sinus pain.  Concern for acute bacterial rhinosinusitis.  Patient discharged with Augmentin.  Instructions given for warm saline nasal wash and recommendations for follow-up with primary care physician.  Return precautions discussed and provided. Patient discharged in stable condition with no unaddressed concerns.  Final Clinical Impression(s) / ED Diagnoses Final diagnoses:  Acute sinusitis, recurrence not specified, unspecified location    Rx / DC Orders ED Discharge Orders          Ordered    amoxicillin-clavulanate (AUGMENTIN) 875-125 MG tablet  Every 12 hours        06/13/21 1859              Antonietta Breach, PA-C 06/13/21 1937

## 2021-09-06 ENCOUNTER — Encounter: Payer: Self-pay | Admitting: Physician Assistant

## 2021-09-06 ENCOUNTER — Ambulatory Visit: Payer: Managed Care, Other (non HMO) | Admitting: Physician Assistant

## 2021-09-06 VITALS — BP 102/65 | HR 79 | Temp 98.0°F | Ht 63.0 in | Wt 215.2 lb

## 2021-09-06 DIAGNOSIS — J4520 Mild intermittent asthma, uncomplicated: Secondary | ICD-10-CM | POA: Diagnosis not present

## 2021-09-06 DIAGNOSIS — R519 Headache, unspecified: Secondary | ICD-10-CM

## 2021-09-06 DIAGNOSIS — J029 Acute pharyngitis, unspecified: Secondary | ICD-10-CM

## 2021-09-06 LAB — POCT RAPID STREP A (OFFICE): Rapid Strep A Screen: NEGATIVE

## 2021-09-06 NOTE — Progress Notes (Signed)
Molly Molina is a 21 y.o. female here for a follow up of a pre-existing problem.  History of Present Illness:   Chief Complaint  Patient presents with   Sore Throat    Pt c/o sore throat; congestion and productive cough. Symptoms started last Tuesday but throat has got more painful since. Unable to swallow liquid without being in pain;     HPI  Sore throat  Patient here with sore throat that has been onset for the past 1 week. Her associated symptoms include nasal congestion and productive cough. She states sore throat has started to get worse. She has been feeling more pain in the back/deep in the throat. She is struggling to eat and drink due to the pain. She states she is having hard time swallowing food. She has tried Aleve with no relief. Has had some ear pressure for the last 2 days. She has been drinking hot drinks and coffee. She is concerned about tonsillitis. Had Covid test which was negative. No fever or chills. No sick contacts.   Asthma She does have a hx of asthma. She states she has not been using Qvar redihaler 80 mcg twice daily and albuterol as prescribed.  States her insurance has not been covering Qvar. Denies any worsening symptoms.   Headaches  Patient complain of recurrent headaches that has been present for the past several years. Located on top of her head. She notes she has been experiencing this daily. Usually wake up and have worsening headache everyday. She has had similar headaches in the past. Had work up in the past and was concerned for tiny aneurysm at that time. She has been drinking excessive caffeine for the past 2 years and thinks this could be contributing. She drinks about 4 double shots of espresso daily. She has been taking Aleve and Benadryl with some improvement. She also tried ice to help alleviate her symptoms. States she is sensitive to light and this makes her headaches worse. Denies any focal weakness, numbness or tingling. Denies double vision,  weakness, n/v, slurred speech, or confusion.  MRA results from 03/23/17 1. No large vessel intracranial occlusion.   2. No hemodynamically significant intracranial or cervical arterial stenosis.   3. Possible 1 mm posterolaterally projecting right posterior communicating artery aneurysm, as further described above. This equivocal finding could be reassessed by follow-up head MRA or further evaluated by head CTA if clinically warranted.   Past Medical History:  Diagnosis Date   Amplified musculoskeletal pain    Anemia    Anxiety    Arthralgia    Fever, arthralgias,rash,abd pain, darrhiea, oral ulcers, and fatigue.   Asthma    Asthma    Autism    Behcet's syndrome (HCC) 2016   Possible   Bipolar 2 disorder (HCC)    per patient diagnosis found out to not be bipolar   Depression    Dysmenorrhea in adolescent    Eczema    History of proteinuria syndrome 2012   + hx of hematuria: nephrol w/u in [place] and [place] both normal.   History of vitamin D deficiency    Major depressive disorder, single episode, severe with psychotic features (HCC) 12/22/2015   Migraine with aura    Mouth ulcers    some vaginal also   Patellar subluxation, left, sequela 08/08/2016   Raynaud's phenomenon    Septic arthritis of hip (HCC) 2010     Social History   Tobacco Use   Smoking status: Never  Passive exposure: Never   Smokeless tobacco: Never  Vaping Use   Vaping Use: Never used  Substance Use Topics   Alcohol use: No    Alcohol/week: 0.0 standard drinks   Drug use: No    Past Surgical History:  Procedure Laterality Date   APPENDECTOMY  2010   MRI L/S spine  2013   Possible bilateral pars defect at L5 without evidence of spondylolisthesis.  Otherwise normal.    Family History  Problem Relation Age of Onset   Cancer Mother    Cervical cancer Mother    Endometriosis Mother    Bipolar disorder Mother    Anxiety disorder Mother    Anxiety disorder Father    Thyroid disease  Maternal Grandmother    Bipolar disorder Maternal Grandmother    Depression Maternal Grandmother    Prostate cancer Maternal Grandfather    Diabetes Maternal Grandfather    Heart disease Maternal Grandfather    Heart disease Paternal Grandmother    Rheumatic fever Paternal Grandmother    Stroke Paternal Grandmother    Depression Paternal Grandmother    Mental illness Paternal Grandmother        either bipolar or schizophrenia   Suicidality Paternal Grandmother        attempted   Anxiety disorder Maternal Aunt    Depression Maternal Aunt    Anxiety disorder Cousin    Depression Cousin    Suicidality Cousin    Suicidality Other    Suicidality Other    Diabetes Paternal Great-grandmother     Allergies  Allergen Reactions   Mushroom Extract Complex Hives and Rash   Cephalosporins Hives and Rash    Current Medications:   Current Outpatient Medications:    Adalimumab (HUMIRA PEN) 40 MG/0.4ML PNKT, Inject 40 mg into the skin once a week. Starting on day 29., Disp: 2 each, Rfl: 4   Adalimumab (HUMIRA PEN-CD/UC/HS STARTER) 80 MG/0.8ML PNKT, Inject 160 mg into the skin as directed. Inject 160 mg subcutaneous on day 1, then 80 mg subcutaneous on day 15., Disp: 1 each, Rfl: 0   albuterol (PROVENTIL) (2.5 MG/3ML) 0.083% nebulizer solution, Take 3 mLs (2.5 mg total) by nebulization every 6 (six) hours as needed for wheezing or shortness of breath., Disp: 150 mL, Rfl: 1   albuterol (VENTOLIN HFA) 108 (90 Base) MCG/ACT inhaler, Inhale 2 puffs into the lungs every 6 (six) hours as needed for wheezing or shortness of breath., Disp: 8 g, Rfl: 2   beclomethasone (QVAR REDIHALER) 80 MCG/ACT inhaler, Inhale 1 puff into the lungs 2 (two) times daily., Disp: 1 each, Rfl: 2   colchicine 0.6 MG tablet, Take 0.6 mg by mouth 2 (two) times daily. , Disp: , Rfl:    hydrOXYzine (VISTARIL) 25 MG capsule, Take 1 capsule (25 mg total) by mouth every 8 (eight) hours as needed., Disp: 30 capsule, Rfl: 0    Review of Systems:   ROS Negative unless otherwise specified per HPI.   Vitals:   Vitals:   09/06/21 1549  BP: 102/65  Pulse: 79  Temp: 98 F (36.7 C)  TempSrc: Temporal  SpO2: 98%  Weight: 215 lb 3.2 oz (97.6 kg)  Height: 5\' 3"  (1.6 m)     Body mass index is 38.12 kg/m.  Physical Exam:   Physical Exam Vitals and nursing note reviewed.  Constitutional:      General: She is not in acute distress.    Appearance: She is well-developed. She is not ill-appearing or toxic-appearing.  HENT:  Head: Normocephalic and atraumatic.     Right Ear: Tympanic membrane, ear canal and external ear normal. Tympanic membrane is not erythematous, retracted or bulging.     Left Ear: Tympanic membrane, ear canal and external ear normal. Tympanic membrane is not erythematous, retracted or bulging.     Nose: Nose normal.     Right Sinus: No maxillary sinus tenderness or frontal sinus tenderness.     Left Sinus: No maxillary sinus tenderness or frontal sinus tenderness.     Mouth/Throat:     Pharynx: Uvula midline. No posterior oropharyngeal erythema.     Tonsils: 1+ on the right. 1+ on the left.  Eyes:     General: Lids are normal.     Conjunctiva/sclera: Conjunctivae normal.  Neck:     Trachea: Trachea normal.  Cardiovascular:     Rate and Rhythm: Normal rate and regular rhythm.     Pulses: Normal pulses.     Heart sounds: Normal heart sounds, S1 normal and S2 normal.  Pulmonary:     Effort: Pulmonary effort is normal.     Breath sounds: Normal breath sounds. No decreased breath sounds, wheezing, rhonchi or rales.  Lymphadenopathy:     Cervical: Cervical adenopathy present.     Right cervical: Superficial cervical adenopathy present.  Skin:    General: Skin is warm and dry.  Neurological:     General: No focal deficit present.     Mental Status: She is alert.     GCS: GCS eye subscore is 4. GCS verbal subscore is 5. GCS motor subscore is 6.     Cranial Nerves: Cranial nerves  2-12 are intact.     Sensory: Sensation is intact.  Psychiatric:        Speech: Speech normal.        Behavior: Behavior normal. Behavior is cooperative.    Assessment and Plan:   Recurrent headache Neuro exam is normal Will refer to neurology for further evaluation; will defer imaging to them If no improvement, follow-up with us If worsening sx, needs to go to the ER  Sore throat Strep test negative Suspect viral URI Did provide pocket rx of doxycycline if sx do not improve Follow-up as needed  Mild intermittent asthma without complication No active wheezing, however did recommend that she restart daily ICS inhaler when feeling sick She is going to call her insurance company and see if there is a more affordable ICS inhaler Follow-up with us prn No red flags  I,Savera Zaman,acting as a scribe for Energy East CorporationSamantha Braelyn Jenson, PA.,have documented all relevant documentation on the behalf of Jarold MottoSamantha Brynnlie Unterreiner, PA,as directed by  Jarold MottoSamantha Raeshawn Vo, PA while in the presence of Jarold MottoSamantha Jamaul Heist, [place].   I, Jarold MottoSamantha Sunil Hue, [place], have reviewed all documentation for this visit. The documentation on 09/06/21 for the exam, diagnosis, procedures, and orders are all accurate and complete.  Jarold MottoSamantha Lonette Stevison, PA-C

## 2021-09-06 NOTE — Patient Instructions (Signed)
It was great to see you!  Strep test is negative.  You have a viral upper respiratory infection. Antibiotics are not needed for this.  Viral infections usually take 7-10 days to resolve.  The cough can last a few weeks to go away.  I have sent in doxycycline should your symptoms not improve.  I have also put in a referral to dermatology.  Push fluids and get plenty of rest. Please return if you are not improving as expected, or if you have high fevers (>101.5) or difficulty swallowing or worsening productive cough.  Call clinic with questions.  I hope you start feeling better soon!

## 2021-09-08 ENCOUNTER — Encounter: Payer: Self-pay | Admitting: Neurology

## 2021-09-17 NOTE — Progress Notes (Deleted)
NEUROLOGY CONSULTATION NOTE  Molly Molina MRN: [id number] DOB: 04-18-2000  Referring provider: Jarold Motto, PA-C Primary care provider: Jarold Motto, PA-C  Reason for consult:  frequent headaches  Assessment/Plan:   ***   Subjective:  Molly Molina is a 21 year old female with asthma, depression and anxiety who presents for frequent headaches.  History supplemented by referring provider's note.  Onset:  *** Location:  top of head Quality:  *** Intensity:  ***.  *** denies new headache, thunderclap headache or severe headache that wakes *** from sleep. Aura:  *** Prodrome:  *** Postdrome:  *** Associated symptoms:  Photophobia.  *** denies associated unilateral numbness or weakness. Duration:  *** Frequency:  daily Frequency of abortive medication: *** Triggers:  bright light Relieving factors:  Aleve, Benadryl Activity:  ***  Prior workup include MRA of head and neck on 03/23/2017 which showed possible 1 mm posterolaterally projecting right posterior communicating artery aneurysm; MRV of head on 03/31/2017 which was unremarkable.  Current NSAIDS/analgesics:  Aleve Current triptans:  *** Current ergotamine:  *** Current anti-emetic:  *** Current muscle relaxants:  *** Current Antihypertensive medications:  *** Current Antidepressant medications:  *** Current Anticonvulsant medications:  *** Current anti-CGRP:  *** Current Vitamins/Herbal/Supplements:  *** Current Antihistamines/Decongestants:  Benadryl Other therapy:  *** Hormone/birth control:  *** Other medications:  Hydroxyzine, colchicine  Past NSAIDS/analgesics:  *** Past abortive triptans:  *** Past abortive ergotamine:  *** Past muscle relaxants:  *** Past anti-emetic:  *** Past antihypertensive medications:  *** Past antidepressant medications:  *** Past anticonvulsant medications:  *** Past anti-CGRP:  *** Past vitamins/Herbal/Supplements:  *** Past antihistamines/decongestants:   *** Other past therapies:  ***  Caffeine:  4 double shots of espresso daily Alcohol:  *** Smoker:  *** Diet:  *** Exercise:  *** Depression:  ***; Anxiety:  *** Other pain:  *** Sleep hygiene:  *** Family history of headache:  ***      PAST MEDICAL HISTORY: Past Medical History:  Diagnosis Date   Amplified musculoskeletal pain    Anemia    Anxiety    Arthralgia    Fever, arthralgias,rash,abd pain, darrhiea, oral ulcers, and fatigue.   Asthma    Asthma    Autism    Behcet's syndrome (HCC) 2016   Possible   Bipolar 2 disorder (HCC)    per patient diagnosis found out to not be bipolar   Depression    Dysmenorrhea in adolescent    Eczema    History of proteinuria syndrome 2012   + hx of hematuria: nephrol w/u in [place] and [place] both normal.   History of vitamin D deficiency    Major depressive disorder, single episode, severe with psychotic features (HCC) 12/22/2015   Migraine with aura    Mouth ulcers    some vaginal also   Patellar subluxation, left, sequela 08/08/2016   Raynaud's phenomenon    Septic arthritis of hip (HCC) 2010    PAST SURGICAL HISTORY: Past Surgical History:  Procedure Laterality Date   APPENDECTOMY  2010   MRI L/S spine  2013   Possible bilateral pars defect at L5 without evidence of spondylolisthesis.  Otherwise normal.    MEDICATIONS: Current Outpatient Medications on File Prior to Visit  Medication Sig Dispense Refill   Adalimumab (HUMIRA PEN) 40 MG/0.4ML PNKT Inject 40 mg into the skin once a week. Starting on day 29. 2 each 4   Adalimumab (HUMIRA PEN-CD/UC/HS STARTER) 80 MG/0.8ML PNKT Inject 160 mg into the skin as  directed. Inject 160 mg subcutaneous on day 1, then 80 mg subcutaneous on day 15. 1 each 0   albuterol (PROVENTIL) (2.5 MG/3ML) 0.083% nebulizer solution Take 3 mLs (2.5 mg total) by nebulization every 6 (six) hours as needed for wheezing or shortness of breath. 150 mL 1   albuterol (VENTOLIN HFA) 108 (90 Base) MCG/ACT  inhaler Inhale 2 puffs into the lungs every 6 (six) hours as needed for wheezing or shortness of breath. 8 g 2   beclomethasone (QVAR REDIHALER) 80 MCG/ACT inhaler Inhale 1 puff into the lungs 2 (two) times daily. 1 each 2   colchicine 0.6 MG tablet Take 0.6 mg by mouth 2 (two) times daily.      hydrOXYzine (VISTARIL) 25 MG capsule Take 1 capsule (25 mg total) by mouth every 8 (eight) hours as needed. 30 capsule 0   No current facility-administered medications on file prior to visit.    ALLERGIES: Allergies  Allergen Reactions   Mushroom Extract Complex Hives and Rash   Cephalosporins Hives and Rash    FAMILY HISTORY: Family History  Problem Relation Age of Onset   Cancer Mother    Cervical cancer Mother    Endometriosis Mother    Bipolar disorder Mother    Anxiety disorder Mother    Anxiety disorder Father    Thyroid disease Maternal Grandmother    Bipolar disorder Maternal Grandmother    Depression Maternal Grandmother    Prostate cancer Maternal Grandfather    Diabetes Maternal Grandfather    Heart disease Maternal Grandfather    Heart disease Paternal Grandmother    Rheumatic fever Paternal Grandmother    Stroke Paternal Grandmother    Depression Paternal Grandmother    Mental illness Paternal Grandmother        either bipolar or schizophrenia   Suicidality Paternal Grandmother        attempted   Anxiety disorder Maternal Aunt    Depression Maternal Aunt    Anxiety disorder Cousin    Depression Cousin    Suicidality Cousin    Suicidality Other    Suicidality Other    Diabetes Paternal Great-grandmother     Objective:  *** General: No acute distress.  Patient appears well-groomed.   Head:  Normocephalic/atraumatic Eyes:  fundi examined but not visualized Neck: supple, no paraspinal tenderness, full range of motion Back: No paraspinal tenderness Heart: regular rate and rhythm Lungs: Clear to auscultation bilaterally. Vascular: No carotid  bruits. Neurological Exam: Mental status: alert and oriented to person, place, and time, recent and remote memory intact, fund of knowledge intact, attention and concentration intact, speech fluent and not dysarthric, language intact. Cranial nerves: CN I: not tested CN II: pupils equal, round and reactive to light, visual fields intact CN III, IV, VI:  full range of motion, no nystagmus, no ptosis CN V: facial sensation intact. CN VII: upper and lower face symmetric CN VIII: hearing intact CN IX, X: gag intact, uvula midline CN XI: sternocleidomastoid and trapezius muscles intact CN XII: tongue midline Bulk & Tone: normal, no fasciculations. Motor:  muscle strength 5/5 throughout Sensation:  Pinprick, temperature and vibratory sensation intact. Deep Tendon Reflexes:  2+ throughout,  toes downgoing.   Finger to nose testing:  Without dysmetria.   Heel to shin:  Without dysmetria.   Gait:  Normal station and stride.  Romberg negative.    Thank you for allowing me to take part in the care of this patient.  Shon Millet, DO  CC: ***

## 2021-09-20 ENCOUNTER — Encounter: Payer: Self-pay | Admitting: Neurology

## 2021-09-20 ENCOUNTER — Ambulatory Visit: Payer: Managed Care, Other (non HMO) | Admitting: Neurology

## 2021-10-20 ENCOUNTER — Encounter: Payer: Self-pay | Admitting: Physician Assistant

## 2021-10-26 ENCOUNTER — Encounter: Payer: Self-pay | Admitting: Physician Assistant

## 2021-10-26 ENCOUNTER — Ambulatory Visit: Payer: Managed Care, Other (non HMO) | Admitting: Physician Assistant

## 2021-10-26 VITALS — BP 110/70 | HR 88 | Temp 97.8°F | Ht 63.0 in | Wt 227.5 lb

## 2021-10-26 DIAGNOSIS — F4010 Social phobia, unspecified: Secondary | ICD-10-CM | POA: Diagnosis not present

## 2021-10-26 DIAGNOSIS — F411 Generalized anxiety disorder: Secondary | ICD-10-CM | POA: Diagnosis not present

## 2021-10-26 DIAGNOSIS — F320 Major depressive disorder, single episode, mild: Secondary | ICD-10-CM

## 2021-10-26 DIAGNOSIS — Z79899 Other long term (current) drug therapy: Secondary | ICD-10-CM

## 2021-10-26 LAB — COMPREHENSIVE METABOLIC PANEL
ALT: 12 U/L (ref 0–35)
AST: 16 U/L (ref 0–37)
Albumin: 4.3 g/dL (ref 3.5–5.2)
Alkaline Phosphatase: 49 U/L (ref 39–117)
BUN: 13 mg/dL (ref 6–23)
CO2: 28 mEq/L (ref 19–32)
Calcium: 10 mg/dL (ref 8.4–10.5)
Chloride: 107 mEq/L (ref 96–112)
Creatinine, Ser: 0.89 mg/dL (ref 0.40–1.20)
GFR: 93.09 mL/min (ref >60.00)
Glucose, Bld: 94 mg/dL (ref 70–99)
Potassium: 5.8 mEq/L — ABNORMAL HIGH (ref 3.5–5.1)
Sodium: 146 mEq/L — ABNORMAL HIGH (ref 135–145)
Total Bilirubin: 0.4 mg/dL (ref 0.2–1.2)
Total Protein: 7.6 g/dL (ref 6.0–8.3)

## 2021-10-26 LAB — CBC WITH DIFFERENTIAL/PLATELET
Basophils Absolute: 0.1 10*3/uL (ref 0.0–0.1)
Basophils Relative: 1.1 % (ref 0.0–3.0)
Eosinophils Absolute: 0.4 10*3/uL (ref 0.0–0.7)
Eosinophils Relative: 7 % — ABNORMAL HIGH (ref 0.0–5.0)
HCT: 39.2 % (ref 36.0–46.0)
Hemoglobin: 12.6 g/dL (ref 12.0–15.0)
Lymphocytes Relative: 38.7 % (ref 12.0–46.0)
Lymphs Abs: 2.3 10*3/uL (ref 0.7–4.0)
MCHC: 32 g/dL (ref 30.0–36.0)
MCV: 81.1 fl (ref 78.0–100.0)
Monocytes Absolute: 0.5 10*3/uL (ref 0.1–1.0)
Monocytes Relative: 8.9 % (ref 3.0–12.0)
Neutro Abs: 2.6 10*3/uL (ref 1.4–7.7)
Neutrophils Relative %: 44.3 % (ref 43.0–77.0)
Platelets: 248 10*3/uL (ref 150.0–400.0)
RBC: 4.84 Mil/uL (ref 3.87–5.11)
RDW: 13.2 % (ref 11.5–14.6)
WBC: 5.9 10*3/uL (ref 4.5–10.5)

## 2021-10-26 LAB — SEDIMENTATION RATE: Sed Rate: 21 mm/hr — ABNORMAL HIGH (ref 0–20)

## 2021-10-26 NOTE — Progress Notes (Signed)
Molly Molina is a 21 y.o. female here for a follow up on anxiety.   istory of Present Illness:   Chief Complaint  Patient presents with   Anxiety   Depression    Pt would like to see a therapist.    HPI  Anxiety/Depression  Patient here to follow-up. She states she has been doing well related to her mood. Has been struggling with to find talk therapist recently.She is currently compliant with taking Hydroxyzine 25 mg as needed. Tolerating well. States she was diagnosed with bipolar disorder at age 78. 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 Her symptoms are well controlled. Feels like she is super emotional and has increased social anxiety. Has been having some ADHD symptoms and she would like to be evaluated for this. Denies any worsening symptoms. Denies SI/HI.   Social Anxiety  Patient has had increased social anxiety and feels like her symptoms are worsening. States she does have good support from friend group. However, she does not like interacting and communicating with people. She does have fhx of Aperger's in her father. She would like to be further evaluated for this. Denies any other concerns.   High risk med use Seeing her rheumatologist tomorrow. She is in need of blood work prior to her appointment. She is taking Humira and sx are currently well controlled. Denies any major concerns at this time.  Past Medical History:  Diagnosis Date   Amplified musculoskeletal pain    Anemia    Anxiety    Arthralgia    Fever, arthralgias,rash,abd pain, darrhiea, oral ulcers, and fatigue.   Asthma    Asthma    Autism    Behcet's syndrome (HCC) 2016   Possible   Bipolar 2 disorder (HCC)    per patient diagnosis found out to not be bipolar   Depression    Dysmenorrhea in adolescent    Eczema    History of proteinuria syndrome 2012   + hx of hematuria: nephrol w/u in [place] and [place] both normal.   History of vitamin D  deficiency    Major depressive disorder, single episode, severe with psychotic features (HCC) 12/22/2015   Migraine with aura    Mouth ulcers    some vaginal also   Patellar subluxation, left, sequela 08/08/2016   Raynaud's phenomenon    Septic arthritis of hip (HCC) 2010     Social History   Tobacco Use   Smoking status: Never    Passive exposure: Never   Smokeless tobacco: Never  Vaping Use   Vaping Use: Never used  Substance Use Topics   Alcohol use: No    Alcohol/week: 0.0 standard drinks of alcohol   Drug use: No    Past Surgical History:  Procedure Laterality Date   APPENDECTOMY  2010   MRI L/S spine  2013   Possible bilateral pars defect at L5 without evidence of spondylolisthesis.  Otherwise normal.    Family History  Problem Relation Age of Onset   Cancer Mother    Cervical cancer Mother    Endometriosis Mother    Bipolar disorder Mother    Anxiety disorder Mother    Anxiety disorder Father    Thyroid disease Maternal Grandmother    Bipolar disorder Maternal Grandmother    Depression Maternal Grandmother    Prostate cancer Maternal Grandfather    Diabetes Maternal Grandfather    Heart disease Maternal Grandfather    Heart disease Paternal  Grandmother    Rheumatic fever Paternal Grandmother    Stroke Paternal Grandmother    Depression Paternal Grandmother    Mental illness Paternal Grandmother        either bipolar or schizophrenia   Suicidality Paternal Grandmother        attempted   Anxiety disorder Maternal Aunt    Depression Maternal Aunt    Anxiety disorder Cousin    Depression Cousin    Suicidality Cousin    Suicidality Other    Suicidality Other    Diabetes Paternal Great-grandmother     Allergies  Allergen Reactions   Mushroom Extract Complex Hives and Rash   Cephalosporins Hives and Rash    Current Medications:   Current Outpatient Medications:    albuterol (PROVENTIL) (2.5 MG/3ML) 0.083% nebulizer solution, Take 3 mLs (2.5 mg  total) by nebulization every 6 (six) hours as needed for wheezing or shortness of breath., Disp: 150 mL, Rfl: 1   albuterol (VENTOLIN HFA) 108 (90 Base) MCG/ACT inhaler, Inhale 2 puffs into the lungs every 6 (six) hours as needed for wheezing or shortness of breath., Disp: 8 g, Rfl: 2   beclomethasone (QVAR REDIHALER) 80 MCG/ACT inhaler, Inhale 1 puff into the lungs 2 (two) times daily., Disp: 1 each, Rfl: 2   colchicine 0.6 MG tablet, Take 0.6 mg by mouth 2 (two) times daily. , Disp: , Rfl:    hydrOXYzine (VISTARIL) 25 MG capsule, Take 1 capsule (25 mg total) by mouth every 8 (eight) hours as needed., Disp: 30 capsule, Rfl: 0   Adalimumab (HUMIRA PEN) 40 MG/0.4ML PNKT, Inject 40 mg into the skin once a week. Starting on day 29. (Patient not taking: Reported on 10/26/2021), Disp: 2 each, Rfl: 4   Adalimumab (HUMIRA PEN-CD/UC/HS STARTER) 80 MG/0.8ML PNKT, Inject 160 mg into the skin as directed. Inject 160 mg subcutaneous on day 1, then 80 mg subcutaneous on day 15. (Patient not taking: Reported on 10/26/2021), Disp: 1 each, Rfl: 0   Review of Systems:   ROS Negative unless otherwise specified per HPI.   Vitals:   Vitals:   10/26/21 0816  BP: 110/70  Pulse: 88  Temp: 97.8 F (36.6 C)  TempSrc: Temporal  Weight: 227 lb 8 oz (103.2 kg)  Height: 5\' 3"  (1.6 m)     Body mass index is 40.3 kg/m.  Physical Exam:   Physical Exam Vitals and nursing note reviewed.  Constitutional:      General: She is not in acute distress.    Appearance: She is well-developed. She is not ill-appearing or toxic-appearing.  Cardiovascular:     Rate and Rhythm: Normal rate and regular rhythm.     Pulses: Normal pulses.     Heart sounds: Normal heart sounds, S1 normal and S2 normal.  Pulmonary:     Effort: Pulmonary effort is normal.     Breath sounds: Normal breath sounds.  Skin:    General: Skin is warm and dry.  Neurological:     Mental Status: She is alert.     GCS: GCS eye subscore is 4. GCS verbal  subscore is 5. GCS motor subscore is 6.  Psychiatric:        Speech: Speech normal.        Behavior: Behavior normal. Behavior is cooperative.     Assessment and Plan:   GAD (generalized anxiety disorder); Depression, major, single episode, mild (HCC) Currently stable Needs referral for talk therapy -- placed Follow-up with prn I discussed with patient  that if they develop any SI, to tell someone immediately and seek medical attention.  Social anxiety disorder Requesting autism testing; dad recently dx with Asperger's  Referral placed  High risk medication use Blood work orders placed today for her rheum appt Will defer mgmt/interpretation to them   [employer] as a Neurosurgeon for [employer], PA.,have documented all relevant documentation on the behalf of Jarold Motto, PA,as directed by  Jarold Motto, PA while in the presence of Jarold Motto, [place].   I, Jarold Motto, [place], have reviewed all documentation for this visit. The documentation on 10/26/21 for the exam, diagnosis, procedures, and orders are all accurate and complete.   Jarold Motto, PA-C

## 2021-10-26 NOTE — Patient Instructions (Addendum)
It was great to see you!  I'd like to place a referral for Autism evaluation  I'd like to place a referral for talk therapy  Look into [facility] (see business card) and also look into [facility] (local therapist) in case the first therapy option does not work  I'm also ordering your blood work today  Take care,  [employer] PA-C

## 2021-11-12 ENCOUNTER — Encounter: Payer: Self-pay | Admitting: Physician Assistant

## 2021-11-15 MED ORDER — ALBUTEROL SULFATE (2.5 MG/3ML) 0.083% IN NEBU: 2.5000 mg | INHALATION_SOLUTION | Freq: Four times a day (QID) | RESPIRATORY_TRACT | 2 refills | Status: DC | PRN

## 2021-11-15 MED ORDER — ALBUTEROL SULFATE HFA 108 (90 BASE) MCG/ACT IN AERS: 2.0000 | INHALATION_SPRAY | Freq: Four times a day (QID) | RESPIRATORY_TRACT | 2 refills | Status: DC | PRN

## 2021-11-15 NOTE — Addendum Note (Signed)
Addended by: Jimmye Norman on: 11/15/2021 01:27 PM   Modules accepted: Orders

## 2021-12-02 ENCOUNTER — Encounter: Payer: Self-pay | Admitting: Physician Assistant

## 2021-12-27 ENCOUNTER — Encounter: Payer: Self-pay | Admitting: *Deleted

## 2022-01-11 NOTE — Progress Notes (Deleted)
NEUROLOGY CONSULTATION NOTE  Margot Pfingsten MRN: [id number] DOB: [birth year]  Referring provider: Inda Coke, PA Primary care provider: Inda Coke, PA  Reason for consult:  headaches  Assessment/Plan:   ***   Subjective:  Molly Molina is a 21 year old female with Behcet's syndrome, asthma, Bipolar 2 disorder, arthralgia, autism spectrum disorder, depression and anxiety who presents for headaches.  History supplemented by referring provider's note.  Onset:  *** Location:  *** Quality:  *** Intensity:  ***.  *** denies new headache, thunderclap headache or severe headache that wakes *** from sleep. Aura:  *** Prodrome:  *** Postdrome:  *** Associated symptoms:  ***.  She denies associated unilateral numbness or weakness. Duration:  *** Frequency:  *** Frequency of abortive medication: *** Triggers:  bright lights Relieving factors:  *** Activity:  *** She had been treating her headaches with daily use of Aleve and Benadryl.  ***  Previous testing for headache and lightheadednesss performed in December 2018.  MRA of head and neck on 03/23/2017 showed no LVO or hemodynamically significant stenosis but did demonstrate possible 1 mm posterolaterally projecting right posterior communicating artery aneurysm.  MRI and MRV of head on 03/29/2017 was unremarkable.    Past NSAIDS/analgesics:  ibuprofen Past abortive triptans:  *** Past abortive ergotamine:  none Past muscle relaxants:  none Past anti-emetic:  Zofran Past antihypertensive medications:  none Past antidepressant medications:  bupropion, fluoxetine Past anticonvulsant medications:  lamotrigine Past anti-CGRP:  none Past vitamins/Herbal/Supplements:  magnesium Past antihistamines/decongestants:  Zyrtec, Flonase Other past therapies:  ***  Current NSAIDS/analgesics:  naproxen Current triptans:  none Current ergotamine:  none Current anti-emetic:  none Current muscle relaxants:  none Current  Antihypertensive medications:  none Current Antidepressant medications:  none Current Anticonvulsant medications:  none Current anti-CGRP:  none Current Vitamins/Herbal/Supplements:  none Current Antihistamines/Decongestants:  Hydroxyzine (anxiety), Benadryl Other therapy:  *** Birth control:  none Other medications:  Humira, albuterol   Caffeine:  4 double shots of espresso daily  Alcohol:  *** Smoker:  *** Diet:  *** Exercise:  *** Depression:  ***; Anxiety:  *** Other pain:  *** Sleep hygiene:  *** Family history of headache:  ***      PAST MEDICAL HISTORY: Past Medical History:  Diagnosis Date   Amplified musculoskeletal pain    Anemia    Anxiety    Arthralgia    Fever, arthralgias,rash,abd pain, darrhiea, oral ulcers, and fatigue.   Asthma    Asthma    Autism    Behcet's syndrome ([place]) 2016   Possible   Bipolar 2 disorder ([place])    per patient diagnosis found out to not be bipolar   Depression    Dysmenorrhea in adolescent    Eczema    History of proteinuria syndrome 2012   + hx of hematuria: nephrol w/u in [place] and [place] both normal.   History of vitamin D deficiency    Major depressive disorder, single episode, severe with psychotic features ([place]) 12/22/2015   Migraine with aura    Mouth ulcers    some vaginal also   Patellar subluxation, left, sequela 08/08/2016   Raynaud's phenomenon    Septic arthritis of hip ([place]) 2010    PAST SURGICAL HISTORY: Past Surgical History:  Procedure Laterality Date   APPENDECTOMY  2010   MRI L/S spine  2013   Possible bilateral pars defect at L5 without evidence of spondylolisthesis.  Otherwise normal.    MEDICATIONS: Current Outpatient Medications on File Prior to Visit  Medication Sig Dispense Refill   Adalimumab (HUMIRA PEN) 40 MG/0.4ML PNKT Inject 40 mg into the skin once a week. Starting on day 29. (Patient not taking: Reported on 10/26/2021) 2 each 4   Adalimumab (HUMIRA PEN-CD/UC/HS STARTER) 80  MG/0.8ML PNKT Inject 160 mg into the skin as directed. Inject 160 mg subcutaneous on day 1, then 80 mg subcutaneous on day 15. (Patient not taking: Reported on 10/26/2021) 1 each 0   albuterol (PROVENTIL) (2.5 MG/3ML) 0.083% nebulizer solution Take 3 mLs (2.5 mg total) by nebulization every 6 (six) hours as needed for wheezing or shortness of breath. 150 mL 2   albuterol (VENTOLIN HFA) 108 (90 Base) MCG/ACT inhaler Inhale 2 puffs into the lungs every 6 (six) hours as needed for wheezing or shortness of breath. 18 g 2   beclomethasone (QVAR REDIHALER) 80 MCG/ACT inhaler Inhale 1 puff into the lungs 2 (two) times daily. 1 each 2   colchicine 0.6 MG tablet Take 0.6 mg by mouth 2 (two) times daily.      hydrOXYzine (VISTARIL) 25 MG capsule Take 1 capsule (25 mg total) by mouth every 8 (eight) hours as needed. 30 capsule 0   No current facility-administered medications on file prior to visit.    ALLERGIES: Allergies  Allergen Reactions   Mushroom Extract Complex Hives and Rash   Cephalosporins Hives and Rash    FAMILY HISTORY: Family History  Problem Relation Age of Onset   Cancer Mother    Cervical cancer Mother    Endometriosis Mother    Bipolar disorder Mother    Anxiety disorder Mother    Anxiety disorder Father    Thyroid disease Maternal Grandmother    Bipolar disorder Maternal Grandmother    Depression Maternal Grandmother    Prostate cancer Maternal Grandfather    Diabetes Maternal Grandfather    Heart disease Maternal Grandfather    Heart disease Paternal Grandmother    Rheumatic fever Paternal Grandmother    Stroke Paternal Grandmother    Depression Paternal Grandmother    Mental illness Paternal Grandmother        either bipolar or schizophrenia   Suicidality Paternal Grandmother        attempted   Anxiety disorder Maternal Aunt    Depression Maternal Aunt    Anxiety disorder Cousin    Depression Cousin    Suicidality Cousin    Suicidality Other    Suicidality Other     Diabetes Paternal Great-grandmother     Objective:  *** General: No acute distress.  Patient appears well-groomed.   Head:  Normocephalic/atraumatic Eyes:  fundi examined but not visualized Neck: supple, no paraspinal tenderness, full range of motion Back: No paraspinal tenderness Heart: regular rate and rhythm Lungs: Clear to auscultation bilaterally. Vascular: No carotid bruits. Neurological Exam: Mental status: alert and oriented to person, place, and time, speech fluent and not dysarthric, language intact. Cranial nerves: CN I: not tested CN II: pupils equal, round and reactive to light, visual fields intact CN III, IV, VI:  full range of motion, no nystagmus, no ptosis CN V: facial sensation intact. CN VII: upper and lower face symmetric CN VIII: hearing intact CN IX, X: gag intact, uvula midline CN XI: sternocleidomastoid and trapezius muscles intact CN XII: tongue midline Bulk & Tone: normal, no fasciculations. Motor:  muscle strength 5/5 throughout Sensation:  Pinprick, temperature and vibratory sensation intact. Deep Tendon Reflexes:  2+ throughout,  toes downgoing.   Finger to nose testing:  Without dysmetria.  Heel to shin:  Without dysmetria.   Gait:  Normal station and stride.  Romberg negative.    Thank you for allowing me to take part in the care of this patient.  Metta Clines, DO  CC: Inda Coke, PA

## 2022-01-12 ENCOUNTER — Encounter: Payer: Self-pay | Admitting: Neurology

## 2022-01-12 ENCOUNTER — Ambulatory Visit: Payer: Managed Care, Other (non HMO) | Admitting: Neurology

## 2022-01-12 DIAGNOSIS — Z029 Encounter for administrative examinations, unspecified: Secondary | ICD-10-CM

## 2022-02-08 ENCOUNTER — Encounter: Payer: Self-pay | Admitting: Physician Assistant

## 2022-02-08 ENCOUNTER — Ambulatory Visit: Payer: Managed Care, Other (non HMO) | Admitting: Physician Assistant

## 2022-02-08 VITALS — BP 120/80 | HR 88 | Temp 97.5°F | Ht 63.0 in | Wt 232.4 lb

## 2022-02-08 DIAGNOSIS — H6122 Impacted cerumen, left ear: Secondary | ICD-10-CM

## 2022-02-08 DIAGNOSIS — F411 Generalized anxiety disorder: Secondary | ICD-10-CM | POA: Diagnosis not present

## 2022-02-08 DIAGNOSIS — J4520 Mild intermittent asthma, uncomplicated: Secondary | ICD-10-CM

## 2022-02-08 DIAGNOSIS — J029 Acute pharyngitis, unspecified: Secondary | ICD-10-CM

## 2022-02-08 LAB — POCT RAPID STREP A (OFFICE): Rapid Strep A Screen: POSITIVE — AB

## 2022-02-08 MED ORDER — HYDROXYZINE PAMOATE 25 MG PO CAPS: 25.0000 mg | ORAL_CAPSULE | Freq: Three times a day (TID) | ORAL | 0 refills | Status: DC | PRN

## 2022-02-08 MED ORDER — AMOXICILLIN 875 MG PO TABS: 875.0000 mg | ORAL_TABLET | Freq: Two times a day (BID) | ORAL | 0 refills | Status: AC

## 2022-02-08 MED ORDER — ALBUTEROL SULFATE (2.5 MG/3ML) 0.083% IN NEBU: 2.5000 mg | INHALATION_SOLUTION | Freq: Four times a day (QID) | RESPIRATORY_TRACT | 2 refills | Status: AC | PRN

## 2022-02-08 NOTE — Patient Instructions (Signed)
It was great to see you!  Start amoxicillin for your strep throat  Start   Take care,  Inda Coke PA-C

## 2022-02-08 NOTE — Progress Notes (Signed)
Molly Molina is a 21 y.o. female here for a new problem.  History of Present Illness:   Chief Complaint  Patient presents with   Sore Throat    Pt c/o sore throat on left side of throat, started yesterday.   Ear Pain    Pt also c/o left ear pain started yesterday and getting worse.    HPI  Sore Throat: She reports that she had a cold this past weekend. She complains of sore throat that started yesterday, 02/07/2022, morning. She tested negative for COVID-19 on 02/06/2022.   Left ear clogged: She notes that her left ear is clogged.   Asthma: She reports her asthma has been better. She is currently take QVAR BID as prescribed. She has had rare use of albuterol but needs refill on nebulizer solution.  Anxiety: She reports her anxiety is better with hydroxyzine 25 mg. Although, she does have mild work stress currently. She is requesting a refill for Hydroxyzine. Denies SI/HI.  Past Medical History:  Diagnosis Date   Amplified musculoskeletal pain    Anemia    Anxiety    Arthralgia    Fever, arthralgias,rash,abd pain, darrhiea, oral ulcers, and fatigue.   Asthma    Asthma    Autism    Behcet's syndrome (HCC) 2016   Possible   Bipolar 2 disorder (HCC)    per patient diagnosis found out to not be bipolar   Depression    Dysmenorrhea in adolescent    Eczema    History of proteinuria syndrome 2012   + hx of hematuria: nephrol w/u in [place] and [place] both normal.   History of vitamin D deficiency    Major depressive disorder, single episode, severe with psychotic features (HCC) 12/22/2015   Migraine with aura    Mouth ulcers    some vaginal also   Patellar subluxation, left, sequela 08/08/2016   Raynaud's phenomenon    Septic arthritis of hip (HCC) 2010     Social History   Tobacco Use   Smoking status: Never    Passive exposure: Never   Smokeless tobacco: Never  Vaping Use   Vaping Use: Never used  Substance Use Topics   Alcohol use: No    Alcohol/week: 0.0  standard drinks of alcohol   Drug use: No    Past Surgical History:  Procedure Laterality Date   APPENDECTOMY  2010   MRI L/S spine  2013   Possible bilateral pars defect at L5 without evidence of spondylolisthesis.  Otherwise normal.    Family History  Problem Relation Age of Onset   Cancer Mother    Cervical cancer Mother    Endometriosis Mother    Bipolar disorder Mother    Anxiety disorder Mother    Anxiety disorder Father    Thyroid disease Maternal Grandmother    Bipolar disorder Maternal Grandmother    Depression Maternal Grandmother    Prostate cancer Maternal Grandfather    Diabetes Maternal Grandfather    Heart disease Maternal Grandfather    Heart disease Paternal Grandmother    Rheumatic fever Paternal Grandmother    Stroke Paternal Grandmother    Depression Paternal Grandmother    Mental illness Paternal Grandmother        either bipolar or schizophrenia   Suicidality Paternal Grandmother        attempted   Anxiety disorder Maternal Aunt    Depression Maternal Aunt    Anxiety disorder Cousin    Depression Cousin    Suicidality Cousin  Suicidality Other    Suicidality Other    Diabetes Paternal Great-grandmother     Allergies  Allergen Reactions   Mushroom Extract Complex Hives and Rash   Cephalosporins Hives and Rash    Current Medications:   Current Outpatient Medications:    albuterol (VENTOLIN HFA) 108 (90 Base) MCG/ACT inhaler, Inhale 2 puffs into the lungs every 6 (six) hours as needed for wheezing or shortness of breath., Disp: 18 g, Rfl: 2   amoxicillin (AMOXIL) 875 MG tablet, Take 1 tablet (875 mg total) by mouth 2 (two) times daily for 10 days., Disp: 20 tablet, Rfl: 0   beclomethasone (QVAR REDIHALER) 80 MCG/ACT inhaler, Inhale 1 puff into the lungs 2 (two) times daily., Disp: 1 each, Rfl: 2   colchicine 0.6 MG tablet, Take 0.6 mg by mouth 2 (two) times daily. , Disp: , Rfl:    Adalimumab (HUMIRA PEN) 40 MG/0.4ML PNKT, Inject 40 mg into  the skin once a week. Starting on day 29. (Patient not taking: Reported on 10/26/2021), Disp: 2 each, Rfl: 4   Adalimumab (HUMIRA PEN-CD/UC/HS STARTER) 80 MG/0.8ML PNKT, Inject 160 mg into the skin as directed. Inject 160 mg subcutaneous on day 1, then 80 mg subcutaneous on day 15. (Patient not taking: Reported on 10/26/2021), Disp: 1 each, Rfl: 0   albuterol (PROVENTIL) (2.5 MG/3ML) 0.083% nebulizer solution, Take 3 mLs (2.5 mg total) by nebulization every 6 (six) hours as needed for wheezing or shortness of breath., Disp: 150 mL, Rfl: 2   hydrOXYzine (VISTARIL) 25 MG capsule, Take 1 capsule (25 mg total) by mouth every 8 (eight) hours as needed., Disp: 30 capsule, Rfl: 0   Review of Systems:   Review of Systems  HENT:  Positive for sore throat.     Vitals:   Vitals:   02/08/22 1137  BP: 120/80  Pulse: 88  Temp: (!) 97.5 F (36.4 C)  TempSrc: Temporal  Weight: 232 lb 6.1 oz (105.4 kg)  Height: 5\' 3"  (1.6 m)     Body mass index is 41.16 kg/m.  Physical Exam:   Physical Exam Constitutional:      General: She is not in acute distress.    Appearance: Normal appearance. She is not ill-appearing.  HENT:     Head: Normocephalic and atraumatic.     Right Ear: Tympanic membrane, ear canal and external ear normal.     Left Ear: External ear normal. There is impacted cerumen.  Eyes:     Extraocular Movements: Extraocular movements intact.     Pupils: Pupils are equal, round, and reactive to light.  Cardiovascular:     Rate and Rhythm: Normal rate and regular rhythm.     Pulses: Normal pulses.     Heart sounds: Normal heart sounds. No murmur heard.    No gallop.  Pulmonary:     Effort: Pulmonary effort is normal. No respiratory distress.     Breath sounds: Normal breath sounds. No wheezing or rales.  Skin:    General: Skin is warm and dry.  Neurological:     Mental Status: She is alert and oriented to person, place, and time.  Psychiatric:        Judgment: Judgment normal.     Results for orders placed or performed in visit on 02/08/22  POCT rapid strep A  Result Value Ref Range   Rapid Strep A Screen Positive (A) Negative    Assessment and Plan:   Sore throat No red flags Strep test positive Start  amoxiciilin per orders Follow-up if new/worsening sx  GAD (generalized anxiety disorder) Well controlled overall Refill hydroxyzine 25 mg  Follow-up in 6 months Denies SI/HI  Mild intermittent asthma without complication Well controlled Continue QVAR Refill nebulizer solution per patient request  Impacted cerumen of left ear We discussed performing lavage at a later date as she is not feeling well at the moment and I'm not sure she could tolerate this presently -- she is agreeable   I,Param Shah,acting as a scribe for [employer], PA.,have documented all relevant documentation on the behalf of Jarold Motto, PA,as directed by  Jarold Motto, PA while in the presence of Jarold Motto, [place].  I, Jarold Motto, [place], have reviewed all documentation for this visit. The documentation on 02/08/22 for the exam, diagnosis, procedures, and orders are all accurate and complete.  Jarold Motto, PA-C

## 2022-03-02 ENCOUNTER — Ambulatory Visit (INDEPENDENT_AMBULATORY_CARE_PROVIDER_SITE_OTHER): Payer: Managed Care, Other (non HMO) | Admitting: Physician Assistant

## 2022-03-02 ENCOUNTER — Encounter: Payer: Self-pay | Admitting: Physician Assistant

## 2022-03-02 VITALS — BP 110/70 | HR 88 | Temp 98.2°F | Ht 63.0 in | Wt 243.0 lb

## 2022-03-02 DIAGNOSIS — H6122 Impacted cerumen, left ear: Secondary | ICD-10-CM

## 2022-03-02 DIAGNOSIS — E669 Obesity, unspecified: Secondary | ICD-10-CM | POA: Diagnosis not present

## 2022-03-02 DIAGNOSIS — Z23 Encounter for immunization: Secondary | ICD-10-CM

## 2022-03-02 NOTE — Patient Instructions (Addendum)
Consider Debrox for your ears  If you want to trial Wegovy -- message me after January  If you want to trial Zepbound -- message me after mid-December

## 2022-03-02 NOTE — Progress Notes (Signed)
Molly Molina is a 21 y.o. female here for a new problem.  History of Present Illness:   Chief Complaint  Patient presents with   Obesity    Pt would like to discuss weight loss medications    HPI  Ear ache Patient reports that she experiences ear pain and has noticed not being able to hear much. She explains that she uses headphones at full volume, but reports that it doesn't feel like its at full volume.  Obesity Patient is inquiring about being put on a weight loss medication as she is having a difficult time being consistent and strict with herself concerning food choices. She states that it is harder to do when she is stressed which she is stressed with work. Patient expresses past hx of binge eating that turned into binge eating and purge for 2 years, but hasn't experienced it in about a year and a half. She reports that her weight fluctuates and has an awful relationship with food. She expresses difficulty dealing with these issues by herself and feels that she would benefit from assistance.   Past Medical History:  Diagnosis Date   Amplified musculoskeletal pain    Anemia    Anxiety    Arthralgia    Fever, arthralgias,rash,abd pain, darrhiea, oral ulcers, and fatigue.   Asthma    Asthma    Autism    Behcet's syndrome (HCC) 2016   Possible   Bipolar 2 disorder (HCC)    per patient diagnosis found out to not be bipolar   Depression    Dysmenorrhea in adolescent    Eczema    History of proteinuria syndrome 2012   + hx of hematuria: nephrol w/u in [place] both normal.   History of vitamin D deficiency    Major depressive disorder, single episode, severe with psychotic features (HCC) 12/22/2015   Migraine with aura    Mouth ulcers    some vaginal also   Patellar subluxation, left, sequela 08/08/2016   Raynaud's phenomenon    Septic arthritis of hip (HCC) 2010     Social History   Tobacco Use   Smoking status: Never    Passive exposure: Never    Smokeless tobacco: Never  Vaping Use   Vaping Use: Never used  Substance Use Topics   Alcohol use: No    Alcohol/week: 0.0 standard drinks of alcohol   Drug use: No    Past Surgical History:  Procedure Laterality Date   APPENDECTOMY  2010   MRI L/S spine  2013   Possible bilateral pars defect at L5 without evidence of spondylolisthesis.  Otherwise normal.    Family History  Problem Relation Age of Onset   Cancer Mother    Cervical cancer Mother    Endometriosis Mother    Bipolar disorder Mother    Anxiety disorder Mother    Anxiety disorder Father    Thyroid disease Maternal Grandmother    Bipolar disorder Maternal Grandmother    Depression Maternal Grandmother    Prostate cancer Maternal Grandfather    Diabetes Maternal Grandfather    Heart disease Maternal Grandfather    Heart disease Paternal Grandmother    Rheumatic fever Paternal Grandmother    Stroke Paternal Grandmother    Depression Paternal Grandmother    Mental illness Paternal Grandmother        either bipolar or schizophrenia   Suicidality Paternal Grandmother        attempted   Anxiety disorder Maternal Aunt  Depression Maternal Aunt    Anxiety disorder Cousin    Depression Cousin    Suicidality Cousin    Suicidality Other    Suicidality Other    Diabetes Paternal Great-grandmother     Allergies  Allergen Reactions   Mushroom Extract Complex Hives and Rash   Cephalosporins Hives and Rash    Current Medications:   Current Outpatient Medications:    albuterol (PROVENTIL) (2.5 MG/3ML) 0.083% nebulizer solution, Take 3 mLs (2.5 mg total) by nebulization every 6 (six) hours as needed for wheezing or shortness of breath., Disp: 150 mL, Rfl: 2   albuterol (VENTOLIN HFA) 108 (90 Base) MCG/ACT inhaler, Inhale 2 puffs into the lungs every 6 (six) hours as needed for wheezing or shortness of breath., Disp: 18 g, Rfl: 2   beclomethasone (QVAR REDIHALER) 80 MCG/ACT inhaler, Inhale 1 puff into the lungs 2  (two) times daily., Disp: 1 each, Rfl: 2   colchicine 0.6 MG tablet, Take 0.6 mg by mouth 2 (two) times daily. , Disp: , Rfl:    hydrOXYzine (VISTARIL) 25 MG capsule, Take 1 capsule (25 mg total) by mouth every 8 (eight) hours as needed., Disp: 30 capsule, Rfl: 0   Adalimumab (HUMIRA PEN) 40 MG/0.4ML PNKT, Inject 40 mg into the skin once a week. Starting on day 29. (Patient not taking: Reported on 10/26/2021), Disp: 2 each, Rfl: 4   Adalimumab (HUMIRA PEN-CD/UC/HS STARTER) 80 MG/0.8ML PNKT, Inject 160 mg into the skin as directed. Inject 160 mg subcutaneous on day 1, then 80 mg subcutaneous on day 15. (Patient not taking: Reported on 10/26/2021), Disp: 1 each, Rfl: 0   Review of Systems:   Review of Systems  HENT:  Positive for ear pain.     Vitals:   Vitals:   03/02/22 1415  BP: 110/70  Pulse: 88  Temp: 98.2 F (36.8 C)  TempSrc: Temporal  Weight: 243 lb (110.2 kg)  Height: 5\' 3"  (1.6 m)     Body mass index is 43.05 kg/m.  Physical Exam:   Physical Exam Vitals and nursing note reviewed.  Constitutional:      General: She is not in acute distress.    Appearance: Normal appearance. She is well-developed. She is not ill-appearing or toxic-appearing.  HENT:     Head: Normocephalic and atraumatic.     Right Ear: Tympanic membrane, ear canal and external ear normal. Tympanic membrane is not erythematous, retracted or bulging.     Left Ear: Tympanic membrane, ear canal and external ear normal. There is impacted cerumen. Tympanic membrane is not erythematous, retracted or bulging.     Nose: Nose normal.     Right Sinus: No maxillary sinus tenderness or frontal sinus tenderness.     Left Sinus: No maxillary sinus tenderness or frontal sinus tenderness.     Mouth/Throat:     Pharynx: Uvula midline. No posterior oropharyngeal erythema.  Eyes:     General: Lids are normal.     Extraocular Movements: Extraocular movements intact.     Conjunctiva/sclera: Conjunctivae normal.      Pupils: Pupils are equal, round, and reactive to light.  Neck:     Trachea: Trachea normal.  Cardiovascular:     Rate and Rhythm: Normal rate and regular rhythm.     Heart sounds: Normal heart sounds, S1 normal and S2 normal. No murmur heard.    No gallop.  Pulmonary:     Effort: Pulmonary effort is normal. No respiratory distress.     Breath sounds:  Normal breath sounds. No decreased breath sounds, wheezing, rhonchi or rales.  Lymphadenopathy:     Cervical: No cervical adenopathy.  Skin:    General: Skin is warm and dry.  Neurological:     Mental Status: She is alert and oriented to person, place, and time.  Psychiatric:        Speech: Speech normal.        Behavior: Behavior normal. Behavior is cooperative.        Judgment: Judgment normal.    Ceruminosis is noted.  Wax is removed by syringing and manual debridement.    Assessment and Plan:   Impacted cerumen of left ear No evidence of infection on my exam Instructions for home care to prevent wax buildup are given.  Obesity, unspecified classification, unspecified obesity type, unspecified whether serious comorbidity present Great candidate for GLP-1 RA Will trial Wegovy or Zepbound when available  Need for immunization against influenza Updated today  I,Verona Buck,acting as a scribe for [employer], PA.,have documented all relevant documentation on the behalf of Jarold Motto, PA,as directed by  Jarold Motto, PA while in the presence of Jarold Motto, [place].  I, Jarold Motto, [place], have reviewed all documentation for this visit. The documentation on 03/02/22 for the exam, diagnosis, procedures, and orders are all accurate and complete.  Jarold Motto, PA-C

## 2022-03-09 ENCOUNTER — Encounter: Payer: Self-pay | Admitting: Physician Assistant

## 2022-03-10 ENCOUNTER — Encounter: Payer: Self-pay | Admitting: Physician Assistant

## 2022-03-10 ENCOUNTER — Other Ambulatory Visit: Payer: Self-pay | Admitting: Physician Assistant

## 2022-03-10 MED ORDER — PREDNISONE 20 MG PO TABS: 40.0000 mg | ORAL_TABLET | Freq: Every day | ORAL | 0 refills | Status: DC

## 2022-03-30 ENCOUNTER — Encounter: Payer: Self-pay | Admitting: Physician Assistant

## 2022-03-31 ENCOUNTER — Other Ambulatory Visit: Payer: Self-pay | Admitting: Physician Assistant

## 2022-03-31 MED ORDER — ZEPBOUND 2.5 MG/0.5ML ~~LOC~~ SOAJ: 2.5000 mg | SUBCUTANEOUS | 0 refills | Status: DC

## 2022-04-07 ENCOUNTER — Other Ambulatory Visit: Payer: Self-pay | Admitting: *Deleted

## 2022-04-07 MED ORDER — WEGOVY 0.25 MG/0.5ML ~~LOC~~ SOAJ: 0.2500 mg | SUBCUTANEOUS | 0 refills | Status: DC

## 2022-04-13 ENCOUNTER — Telehealth: Payer: Self-pay | Admitting: *Deleted

## 2022-04-13 NOTE — Telephone Encounter (Signed)
Left message on voicemail to call office.  

## 2022-04-29 ENCOUNTER — Encounter: Payer: Self-pay | Admitting: Physician Assistant

## 2022-04-29 DIAGNOSIS — R059 Cough, unspecified: Secondary | ICD-10-CM

## 2022-04-29 MED ORDER — BENZONATATE 100 MG PO CAPS: 100.0000 mg | ORAL_CAPSULE | Freq: Three times a day (TID) | ORAL | 0 refills | Status: DC

## 2022-05-16 ENCOUNTER — Encounter: Payer: Self-pay | Admitting: Physician Assistant

## 2022-05-20 ENCOUNTER — Ambulatory Visit (INDEPENDENT_AMBULATORY_CARE_PROVIDER_SITE_OTHER): Payer: Managed Care, Other (non HMO) | Admitting: Physician Assistant

## 2022-05-20 ENCOUNTER — Encounter: Payer: Self-pay | Admitting: Physician Assistant

## 2022-05-20 VITALS — BP 110/70 | HR 80 | Temp 97.7°F | Ht 63.0 in | Wt 257.0 lb

## 2022-05-20 DIAGNOSIS — E669 Obesity, unspecified: Secondary | ICD-10-CM

## 2022-05-20 DIAGNOSIS — R059 Cough, unspecified: Secondary | ICD-10-CM | POA: Diagnosis not present

## 2022-05-20 MED ORDER — DOXYCYCLINE HYCLATE 100 MG PO TABS: 100.0000 mg | ORAL_TABLET | Freq: Two times a day (BID) | ORAL | 0 refills | Status: DC

## 2022-05-20 NOTE — Patient Instructions (Addendum)
It was great to see you!  Start doxycycline   Keep me posted on your symptoms  Take care,  Inda Coke PA-C

## 2022-05-20 NOTE — Progress Notes (Signed)
Molly Molina is a 22 y.o. female here for a new problem.  History of Present Illness:   Chief Complaint  Patient presents with   Cough    Pt c/o cough started again Monday, expectorating thick green sputum, fatigue. No fever or chills.    HPI  Cough Patient reports that she had Covid about 4 weeks ago and doesn't know where she got it from. She states that she was feeling better for the most part when she began feeling sick again starting Monday. Patient is experiencing coughing with green sputum, ear popping, sore throat, and nausea. She denies ear pain. She managed her symptoms with nebulizer treatments, robitussin, cough pills, mucinex, humidifier, and green tea. Patient states that the robitussin did not help.  Obesity -We tried to send in [place] and set down for her but neither were approved by her insurance. She does endorse that she is craving lots of high carb foods such as she does and bagels. She is trying to start working on exercise.  Past Medical History:  Diagnosis Date   Amplified musculoskeletal pain    Anemia    Anxiety    Arthralgia    Fever, arthralgias,rash,abd pain, darrhiea, oral ulcers, and fatigue.   Asthma    Asthma    Autism    Behcet's syndrome ([place]) 2016   Possible   Bipolar 2 disorder ([place])    per patient diagnosis found out to not be bipolar   Depression    Dysmenorrhea in adolescent    Eczema    History of proteinuria syndrome 2012   + hx of hematuria: nephrol w/u in [place] and [place] both normal.   History of vitamin D deficiency    Major depressive disorder, single episode, severe with psychotic features ([place]) 12/22/2015   Migraine with aura    Mouth ulcers    some vaginal also   Patellar subluxation, left, sequela 08/08/2016   Raynaud's phenomenon    Septic arthritis of hip ([place]) 2010     Social History   Tobacco Use   Smoking status: Never    Passive exposure: Never   Smokeless tobacco: Never  Vaping Use   Vaping Use:  Never used  Substance Use Topics   Alcohol use: No    Alcohol/week: 0.0 standard drinks of alcohol   Drug use: No    Past Surgical History:  Procedure Laterality Date   APPENDECTOMY  2010   MRI L/S spine  2013   Possible bilateral pars defect at L5 without evidence of spondylolisthesis.  Otherwise normal.    Family History  Problem Relation Age of Onset   Cancer Mother    Cervical cancer Mother    Endometriosis Mother    Bipolar disorder Mother    Anxiety disorder Mother    Anxiety disorder Father    Thyroid disease Maternal Grandmother    Bipolar disorder Maternal Grandmother    Depression Maternal Grandmother    Prostate cancer Maternal Grandfather    Diabetes Maternal Grandfather    Heart disease Maternal Grandfather    Heart disease Paternal Grandmother    Rheumatic fever Paternal Grandmother    Stroke Paternal Grandmother    Depression Paternal Grandmother    Mental illness Paternal Grandmother        either bipolar or schizophrenia   Suicidality Paternal Grandmother        attempted   Anxiety disorder Maternal Aunt    Depression Maternal Aunt    Anxiety disorder Cousin  Depression Cousin    Suicidality Cousin    Suicidality Other    Suicidality Other    Diabetes Paternal Great-grandmother     Allergies  Allergen Reactions   Mushroom Extract Complex Hives and Rash   Cephalosporins Hives and Rash    Current Medications:   Current Outpatient Medications:    doxycycline (VIBRA-TABS) 100 MG tablet, Take 1 tablet (100 mg total) by mouth 2 (two) times daily., Disp: 20 tablet, Rfl: 0   Semaglutide-Weight Management (WEGOVY) 0.25 MG/0.5ML SOAJ, Inject 0.25 mg into the skin once a week. (Patient not taking: Reported on 05/20/2022), Disp: 2 mL, Rfl: 0   albuterol (PROVENTIL) (2.5 MG/3ML) 0.083% nebulizer solution, Take 3 mLs (2.5 mg total) by nebulization every 6 (six) hours as needed for wheezing or shortness of breath., Disp: 150 mL, Rfl: 2   albuterol  (VENTOLIN HFA) 108 (90 Base) MCG/ACT inhaler, Inhale 2 puffs into the lungs every 6 (six) hours as needed for wheezing or shortness of breath., Disp: 18 g, Rfl: 2   beclomethasone (QVAR REDIHALER) 80 MCG/ACT inhaler, Inhale 1 puff into the lungs 2 (two) times daily., Disp: 1 each, Rfl: 2   benzonatate (TESSALON) 100 MG capsule, Take 1 capsule (100 mg total) by mouth 3 (three) times daily., Disp: 20 capsule, Rfl: 0   colchicine 0.6 MG tablet, Take 0.6 mg by mouth 2 (two) times daily. , Disp: , Rfl:    hydrOXYzine (VISTARIL) 25 MG capsule, Take 1 capsule (25 mg total) by mouth every 8 (eight) hours as needed., Disp: 30 capsule, Rfl: 0   Review of Systems:   Review of Systems  HENT:  Positive for sore throat.   Respiratory:  Positive for cough (green sputum).   Gastrointestinal:  Positive for nausea.    Vitals:   Vitals:   05/20/22 1041  BP: 110/70  Pulse: 80  Temp: 97.7 F (36.5 C)  TempSrc: Temporal  SpO2: 97%  Weight: 257 lb (116.6 kg)  Height: 5' 3"$  (1.6 m)     Body mass index is 45.53 kg/m.  Physical Exam:   Physical Exam Vitals and nursing note reviewed.  Constitutional:      General: She is not in acute distress.    Appearance: Normal appearance. She is well-developed. She is not ill-appearing or toxic-appearing.  HENT:     Head: Normocephalic and atraumatic.     Right Ear: Tympanic membrane, ear canal and external ear normal. Tympanic membrane is not erythematous, retracted or bulging.     Left Ear: Tympanic membrane, ear canal and external ear normal. Tympanic membrane is not erythematous, retracted or bulging.     Nose:     Right Sinus: Frontal sinus tenderness present. No maxillary sinus tenderness.     Left Sinus: Frontal sinus tenderness present. No maxillary sinus tenderness.     Mouth/Throat:     Pharynx: Uvula midline. No posterior oropharyngeal erythema.  Eyes:     General: Lids are normal.     Extraocular Movements: Extraocular movements intact.      Conjunctiva/sclera: Conjunctivae normal.     Pupils: Pupils are equal, round, and reactive to light.  Neck:     Trachea: Trachea normal.  Cardiovascular:     Rate and Rhythm: Normal rate and regular rhythm.     Heart sounds: Normal heart sounds, S1 normal and S2 normal. No murmur heard.    No gallop.  Pulmonary:     Effort: Pulmonary effort is normal. No respiratory distress.  Breath sounds: Normal breath sounds. No decreased breath sounds, wheezing, rhonchi or rales.  Lymphadenopathy:     Cervical: No cervical adenopathy.  Skin:    General: Skin is warm and dry.  Neurological:     Mental Status: She is alert and oriented to person, place, and time.  Psychiatric:        Speech: Speech normal.        Behavior: Behavior normal. Behavior is cooperative.        Judgment: Judgment normal.     Assessment and Plan:   Cough, unspecified type No red flags on exam.  Will initiate doxycycline per orders. Discussed taking medications as prescribed. Reviewed return precautions including worsening fever, SOB, worsening cough or other concerns. Push fluids and rest. I recommend that patient follow-up if symptoms worsen or persist despite treatment x 7-10 days, sooner if needed.  Obesity, unspecified classification, unspecified obesity type, unspecified whether serious comorbidity present Unable to prescribe GLP-1 once due to insurance purposes Continue efforts at healthy lifestyle and we will continue to work on medications as able  I,Verona Buck,acting as a Education administrator for [employer], PA.,have documented all relevant documentation on the behalf of Inda Coke, PA,as directed by  Inda Coke, PA while in the presence of Inda Coke, [place].  I, Inda Coke, [place], have reviewed all documentation for this visit. The documentation on 05/20/22 for the exam, diagnosis, procedures, and orders are all accurate and complete.   Inda Coke, PA-C

## 2022-05-23 ENCOUNTER — Telehealth: Payer: Self-pay | Admitting: *Deleted

## 2022-05-23 NOTE — Telephone Encounter (Signed)
Received fax from pharmacy PA needed for Wegovy 0.25 mg. PA done thru Covermymeds. Awaiting determination. KEY: [id number]

## 2022-05-23 NOTE — Telephone Encounter (Signed)
PA cancelled due to not covered by pt's insurance according to last visit.

## 2022-06-23 ENCOUNTER — Encounter: Payer: Self-pay | Admitting: Physician Assistant

## 2022-06-23 MED ORDER — WEGOVY 0.25 MG/0.5ML ~~LOC~~ SOAJ: 0.2500 mg | SUBCUTANEOUS | 0 refills | Status: DC

## 2022-06-27 ENCOUNTER — Telehealth: Payer: Self-pay | Admitting: *Deleted

## 2022-06-27 NOTE — Telephone Encounter (Signed)
Received fax from pharmacy PA needed for Wegovy 0.25 mg. PA done thru Covermymeds. Awaiting determination. KEY: [id number]

## 2022-06-28 NOTE — Telephone Encounter (Signed)
Received response from [facility], Wegovy 0.25 mg has been denied due to pt has not been in a comprehensive weight management program for at least 6 months before starting medication.

## 2022-06-29 NOTE — Telephone Encounter (Signed)
Left message on voicemail to call office.  

## 2022-07-04 ENCOUNTER — Other Ambulatory Visit: Payer: Self-pay | Admitting: Physician Assistant

## 2022-07-05 MED ORDER — HYDROXYZINE PAMOATE 25 MG PO CAPS: 25.0000 mg | ORAL_CAPSULE | Freq: Three times a day (TID) | ORAL | 0 refills | Status: DC | PRN

## 2022-07-12 ENCOUNTER — Encounter: Payer: Self-pay | Admitting: Physician Assistant

## 2022-07-12 NOTE — Telephone Encounter (Signed)
See My Chart message. Pt aware. 

## 2022-08-04 ENCOUNTER — Encounter: Payer: Self-pay | Admitting: Physician Assistant

## 2022-08-23 ENCOUNTER — Ambulatory Visit: Payer: Managed Care, Other (non HMO) | Admitting: Physician Assistant

## 2022-08-23 ENCOUNTER — Ambulatory Visit (INDEPENDENT_AMBULATORY_CARE_PROVIDER_SITE_OTHER)
Admission: RE | Admit: 2022-08-23 | Discharge: 2022-08-23 | Disposition: A | Discharge status: Home / Self Care | Payer: Managed Care, Other (non HMO) | Source: Ambulatory Visit | Attending: Physician Assistant | Admitting: Physician Assistant

## 2022-08-23 ENCOUNTER — Encounter: Payer: Self-pay | Admitting: Physician Assistant

## 2022-08-23 VITALS — BP 120/80 | HR 75 | Temp 97.8°F | Ht 63.0 in | Wt 262.0 lb

## 2022-08-23 DIAGNOSIS — J029 Acute pharyngitis, unspecified: Secondary | ICD-10-CM

## 2022-08-23 DIAGNOSIS — R052 Subacute cough: Secondary | ICD-10-CM | POA: Diagnosis not present

## 2022-08-23 LAB — POCT RAPID STREP A (OFFICE): Rapid Strep A Screen: POSITIVE — AB

## 2022-08-23 MED ORDER — MONTELUKAST SODIUM 10 MG PO TABS: 10.0000 mg | ORAL_TABLET | Freq: Every day | ORAL | 3 refills | Status: DC

## 2022-08-23 MED ORDER — AMOXICILLIN-POT CLAVULANATE 875-125 MG PO TABS: 1.0000 | ORAL_TABLET | Freq: Two times a day (BID) | ORAL | 0 refills | Status: DC

## 2022-08-23 NOTE — Patient Instructions (Signed)
It was great to see you!  Start Augmentin for your strep throat Continue zyrtec Start singulair at night   An order for xray has been put in for you. To have this done, you can walk in at the [facility] location without a scheduled appointment.  The address is [address] It is across the street from [facility]. [facility] is located in the basement.   Hours of operation are M-F 8:30am to 5:00pm.  Please note that they are closed for lunch between 12:30 and 1:00pm.  If ongoing symptoms, please let me know and we will get a CT scan of your neck  Take care,  Jarold Motto PA-C

## 2022-08-23 NOTE — Progress Notes (Signed)
Molly Molina is a 23 y.o. female here for a new problem.  History of Present Illness:   Chief Complaint  Patient presents with   Sore Throat    PT c/o sore throat started last Wed, next day cough, chest congestion, coughing spells, expectorating thick green sputum at times, chills off and on.    HPI  Sore throat Wednesday symptoms started Throat raw with severe POST NASAL DRIP Currently has chest congestion with thick green mucus Didn't take temperature but felt feverish on Friday, also had chills Poor appetite Everyone at home is sick as well with upper respiratory infection (URI) symptoms  Feels like chest congestion has lingered since COVID she had in Jan Using QVAR once daily Has tried albuterol but not taking regularly  Denies hemoptysis, shortness of breath, chest pain, unintentional weight loss  Currently taking: aleve, saline wash, Neti pot, humidifier  Past Medical History:  Diagnosis Date   Amplified musculoskeletal pain    Anemia    Anxiety    Arthralgia    Fever, arthralgias,rash,abd pain, darrhiea, oral ulcers, and fatigue.   Asthma    Asthma    Autism    Behcet's syndrome (HCC) 2016   Possible   Bipolar 2 disorder (HCC)    per patient diagnosis found out to not be bipolar   Depression    Dysmenorrhea in adolescent    Eczema    History of proteinuria syndrome 2012   + hx of hematuria: nephrol w/u in [place] and [place] both normal.   History of vitamin D deficiency    Major depressive disorder, single episode, severe with psychotic features (HCC) 12/22/2015   Migraine with aura    Mouth ulcers    some vaginal also   Patellar subluxation, left, sequela 08/08/2016   Raynaud's phenomenon    Septic arthritis of hip (HCC) 2010     Social History   Tobacco Use   Smoking status: Never    Passive exposure: Never   Smokeless tobacco: Never  Vaping Use   Vaping Use: Never used  Substance Use Topics   Alcohol use: No    Alcohol/week: 0.0  standard drinks of alcohol   Drug use: No    Past Surgical History:  Procedure Laterality Date   APPENDECTOMY  2010   MRI L/S spine  2013   Possible bilateral pars defect at L5 without evidence of spondylolisthesis.  Otherwise normal.    Family History  Problem Relation Age of Onset   Cancer Mother    Cervical cancer Mother    Endometriosis Mother    Bipolar disorder Mother    Anxiety disorder Mother    Anxiety disorder Father    Thyroid disease Maternal Grandmother    Bipolar disorder Maternal Grandmother    Depression Maternal Grandmother    Prostate cancer Maternal Grandfather    Diabetes Maternal Grandfather    Heart disease Maternal Grandfather    Heart disease Paternal Grandmother    Rheumatic fever Paternal Grandmother    Stroke Paternal Grandmother    Depression Paternal Grandmother    Mental illness Paternal Grandmother        either bipolar or schizophrenia   Suicidality Paternal Grandmother        attempted   Anxiety disorder Maternal Aunt    Depression Maternal Aunt    Anxiety disorder Cousin    Depression Cousin    Suicidality Cousin    Suicidality Other    Suicidality Other    Diabetes Paternal Great-grandmother  Allergies  Allergen Reactions   Mushroom Extract Complex Hives and Rash   Cephalosporins Hives and Rash    Current Medications:   Current Outpatient Medications:    albuterol (PROVENTIL) (2.5 MG/3ML) 0.083% nebulizer solution, Take 3 mLs (2.5 mg total) by nebulization every 6 (six) hours as needed for wheezing or shortness of breath., Disp: 150 mL, Rfl: 2   albuterol (VENTOLIN HFA) 108 (90 Base) MCG/ACT inhaler, Inhale 2 puffs into the lungs every 6 (six) hours as needed for wheezing or shortness of breath., Disp: 18 g, Rfl: 2   beclomethasone (QVAR REDIHALER) 80 MCG/ACT inhaler, Inhale 1 puff into the lungs 2 (two) times daily., Disp: 1 each, Rfl: 2   colchicine 0.6 MG tablet, Take 0.6 mg by mouth 2 (two) times daily. , Disp: , Rfl:     hydrOXYzine (VISTARIL) 25 MG capsule, Take 1 capsule (25 mg total) by mouth every 8 (eight) hours as needed., Disp: 30 capsule, Rfl: 0   JUNEL FE 1/20 1-20 MG-MCG tablet, Take 1 tablet by mouth daily., Disp: , Rfl:    Review of Systems:   ROS Negative unless otherwise specified per HPI.  Vitals:   Vitals:   08/23/22 1415  BP: 120/80  Pulse: 75  Temp: 97.8 F (36.6 C)  TempSrc: Temporal  SpO2: 100%  Weight: 262 lb (118.8 kg)  Height: 5\' 3"  (1.6 m)     Body mass index is 46.41 kg/m.  Physical Exam:   Physical Exam Vitals and nursing note reviewed.  Constitutional:      General: She is not in acute distress.    Appearance: She is well-developed. She is not ill-appearing or toxic-appearing.  HENT:     Head: Normocephalic and atraumatic.     Right Ear: Tympanic membrane, ear canal and external ear normal. Tympanic membrane is not erythematous, retracted or bulging.     Left Ear: Tympanic membrane, ear canal and external ear normal. Tympanic membrane is not erythematous, retracted or bulging.     Nose: Nose normal.     Right Sinus: No maxillary sinus tenderness or frontal sinus tenderness.     Left Sinus: No maxillary sinus tenderness or frontal sinus tenderness.     Mouth/Throat:     Pharynx: Uvula midline. Posterior oropharyngeal erythema present.     Tonsils: No tonsillar exudate. 1+ on the right. 1+ on the left.  Eyes:     General: Lids are normal.     Conjunctiva/sclera: Conjunctivae normal.  Neck:     Trachea: Trachea normal.  Cardiovascular:     Rate and Rhythm: Normal rate and regular rhythm.     Heart sounds: Normal heart sounds, S1 normal and S2 normal.  Pulmonary:     Effort: Pulmonary effort is normal.     Breath sounds: Normal breath sounds. No decreased breath sounds, wheezing, rhonchi or rales.  Lymphadenopathy:     Cervical: No cervical adenopathy.  Skin:    General: Skin is warm and dry.  Neurological:     Mental Status: She is alert.   Psychiatric:        Speech: Speech normal.        Behavior: Behavior normal. Behavior is cooperative.    Results for orders placed or performed in visit on 08/23/22  POCT rapid strep A  Result Value Ref Range   Rapid Strep A Screen Positive (A) Negative     Assessment and Plan:   Sore throat No red flags on exam.  Will initiate Augmentin per  orders.  Choosing Augmentin today due to concern for sinusitis symptoms as well.  Discussed taking medications as prescribed. Reviewed return precautions including worsening fever, SOB, worsening cough or other concerns. Push fluids and rest. I recommend that patient follow-up if symptoms worsen or persist despite treatment x 7-10 days, sooner if needed.   Subacute cough Suspect this is possibly related to long-haul COVID symptoms given chronicity since January She is requesting a chest x-ray, this is not unreasonable, I have ordered this for her today I have also recommended that she take her Qvar twice daily Consider pulmonary referral     Jarold Motto, PA-C

## 2022-11-02 ENCOUNTER — Telehealth: Payer: Managed Care, Other (non HMO) | Admitting: Nurse Practitioner

## 2022-11-02 DIAGNOSIS — Z20822 Contact with and (suspected) exposure to covid-19: Secondary | ICD-10-CM

## 2022-11-02 DIAGNOSIS — J4521 Mild intermittent asthma with (acute) exacerbation: Secondary | ICD-10-CM

## 2022-11-02 NOTE — Progress Notes (Signed)
Virtual Visit Consent   Molly Molina, you are scheduled for a virtual visit with a [facility] provider today. Just as with appointments in the office, your consent must be obtained to participate. Your consent will be active for this visit and any virtual visit you may have with one of our providers in the next 365 days. If you have a MyChart account, a copy of this consent can be sent to you electronically.  As this is a virtual visit, video technology does not allow for your provider to perform a traditional examination. This may limit your provider's ability to fully assess your condition. If your provider identifies any concerns that need to be evaluated in person or the need to arrange testing (such as labs, EKG, etc.), we will make arrangements to do so. Although advances in technology are sophisticated, we cannot ensure that it will always work on either your end or our end. If the connection with a video visit is poor, the visit may have to be switched to a telephone visit. With either a video or telephone visit, we are not always able to ensure that we have a secure connection.  By engaging in this virtual visit, you consent to the provision of healthcare and authorize for your insurance to be billed (if applicable) for the services provided during this visit. Depending on your insurance coverage, you may receive a charge related to this service.  I need to obtain your verbal consent now. Are you willing to proceed with your visit today? [name] has provided verbal consent on 11/02/2022 for a virtual visit (video or telephone). Viviano Simas, FNP  Date: 11/02/2022 4:03 PM  Virtual Visit via Video Note   I, Viviano Simas, connected with  Molly Molina  ([id number], October 12, 2000) on 11/02/22 at  4:00 PM EDT by a video-enabled telemedicine application and verified that I am speaking with the correct person using two identifiers.  Location: Patient: Virtual Visit Location Patient:  Home Provider: Virtual Visit Location Provider: Home Office   I discussed the limitations of evaluation and management by telemedicine and the availability of in person appointments. The patient expressed understanding and agreed to proceed.    History of Present Illness: Molly Molina is a 22 y.o. who identifies as a female who was assigned female at birth, and is being seen today for COVID exposure management.   She is living with her parents that both have COVID currently  Patient has been experiencing nasal congestion without a runny nose  No fever  She has not tested herself for COVID at this time.   She has had COVID in the past  Most recently when she had COVID she did end up with bronchitis   She does have a history of asthma  She does have an inhaler and a nebulizer machine as well   She has been using Robitussin OTC for relief   Problems:  Patient Active Problem List   Diagnosis Date Noted   Dyspepsia 07/14/2017   Subluxation of left patella 08/08/2016   Left knee pain 06/21/2016   Raynaud's phenomenon    Eczema    Dysmenorrhea in adolescent    Asthma    Amplified musculoskeletal pain    Bipolar affective disorder, depressed, moderate (HCC) 02/08/2016   Severe anxiety with panic 01/14/2016   Irregular menses 04/15/2015   Recurrent mouth ulceration 12/15/2014   Arthralgia 12/10/2014   Behcet's syndrome (HCC) 04/04/2014   History of proteinuria syndrome 04/04/2010    Allergies:  Allergies  Allergen Reactions   Mushroom Extract Complex Hives and Rash   Cephalosporins Hives and Rash   Medications:  Current Outpatient Medications:    albuterol (PROVENTIL) (2.5 MG/3ML) 0.083% nebulizer solution, Take 3 mLs (2.5 mg total) by nebulization every 6 (six) hours as needed for wheezing or shortness of breath., Disp: 150 mL, Rfl: 2   albuterol (VENTOLIN HFA) 108 (90 Base) MCG/ACT inhaler, Inhale 2 puffs into the lungs every 6 (six) hours as needed for wheezing or  shortness of breath., Disp: 18 g, Rfl: 2   amoxicillin-clavulanate (AUGMENTIN) 875-125 MG tablet, Take 1 tablet by mouth 2 (two) times daily., Disp: 20 tablet, Rfl: 0   beclomethasone (QVAR REDIHALER) 80 MCG/ACT inhaler, Inhale 1 puff into the lungs 2 (two) times daily., Disp: 1 each, Rfl: 2   cetirizine (ZYRTEC) 10 MG tablet, Take 10 mg by mouth daily., Disp: , Rfl:    colchicine 0.6 MG tablet, Take 0.6 mg by mouth 2 (two) times daily. , Disp: , Rfl:    hydrOXYzine (VISTARIL) 25 MG capsule, Take 1 capsule (25 mg total) by mouth every 8 (eight) hours as needed., Disp: 30 capsule, Rfl: 0   JUNEL FE 1/20 1-20 MG-MCG tablet, Take 1 tablet by mouth daily., Disp: , Rfl:    montelukast (SINGULAIR) 10 MG tablet, Take 1 tablet (10 mg total) by mouth at bedtime., Disp: 30 tablet, Rfl: 3  Observations/Objective: Patient is well-developed, well-nourished in no acute distress.  Resting comfortably  at home.  Head is normocephalic, atraumatic.  No labored breathing.  Speech is clear and coherent with logical content.  Patient is alert and oriented at baseline.    Assessment and Plan: 1. Exposure to COVID-19 virus Advised testing at home and follow up with positive results if she would like to discuss anti-viral medications more   Discussed managing symptoms with over the counter medications as she has been  Following up with new or worsening symptoms   Continuing immune support  Pushing fluids  Assure protein intake    2. Mild intermittent asthma with acute exacerbation Continue Albuterol inhaler every 4-6 hours as needed  Follow Up Instructions: I discussed the assessment and treatment plan with the patient. The patient was provided an opportunity to ask questions and all were answered. The patient agreed with the plan and demonstrated an understanding of the instructions.  A copy of instructions were sent to the patient via MyChart unless otherwise noted below.    The patient was advised to  call back or seek an in-person evaluation if the symptoms worsen or if the condition fails to improve as anticipated.  Time:  I spent 11 minutes with the patient via telehealth technology discussing the above problems/concerns.    Viviano Simas, FNP

## 2022-11-23 ENCOUNTER — Other Ambulatory Visit: Payer: Self-pay | Admitting: Physician Assistant

## 2022-12-15 ENCOUNTER — Ambulatory Visit: Payer: Managed Care, Other (non HMO) | Admitting: Physician Assistant

## 2022-12-15 ENCOUNTER — Ambulatory Visit (INDEPENDENT_AMBULATORY_CARE_PROVIDER_SITE_OTHER)
Admission: RE | Admit: 2022-12-15 | Discharge: 2022-12-15 | Disposition: A | Discharge status: Home / Self Care | Payer: Managed Care, Other (non HMO) | Source: Ambulatory Visit | Attending: Physician Assistant | Admitting: Physician Assistant

## 2022-12-15 VITALS — BP 110/76 | HR 79 | Temp 97.5°F | Ht 63.0 in | Wt 254.2 lb

## 2022-12-15 DIAGNOSIS — J4521 Mild intermittent asthma with (acute) exacerbation: Secondary | ICD-10-CM

## 2022-12-15 DIAGNOSIS — M79672 Pain in left foot: Secondary | ICD-10-CM

## 2022-12-15 DIAGNOSIS — Z23 Encounter for immunization: Secondary | ICD-10-CM

## 2022-12-15 MED ORDER — QVAR REDIHALER 80 MCG/ACT IN AERB: 1.0000 | INHALATION_SPRAY | Freq: Two times a day (BID) | RESPIRATORY_TRACT | 2 refills | Status: DC

## 2022-12-15 MED ORDER — MONTELUKAST SODIUM 10 MG PO TABS: 10.0000 mg | ORAL_TABLET | Freq: Every day | ORAL | 5 refills | Status: AC

## 2022-12-15 MED ORDER — HYDROXYZINE PAMOATE 25 MG PO CAPS: 25.0000 mg | ORAL_CAPSULE | Freq: Three times a day (TID) | ORAL | 0 refills | Status: DC | PRN

## 2022-12-15 NOTE — Patient Instructions (Addendum)
It was great to see you!  An order for xray has been put in for you. To have this done, you can walk in at the [facility] location without a scheduled appointment.  The address is [address] It is across the street from [facility]. X-ray are located in the basement.   Hours of operation are M-F 8:30am to 5:00pm.  Please note that they are closed for lunch between 12:30 and 1:00pm.  Take care,  Jarold Motto PA-C

## 2022-12-15 NOTE — Progress Notes (Signed)
Molly Molina is a 22 y.o. female here for a new problem.  History of Present Illness:   Chief Complaint  Patient presents with   Foot Pain    Pt c/o left pain top of left foot and end of toe area x 3 weeks. Pt fell 2 months, then sprained ankle a month ago x's two. Her boyfriend popped her left 2nd toe and it hurt a lot. She has taken Aleve and used ice packs with some relief.    HPI  Foot Pain (Left): Complains of left dorsal foot pain diffuse to left 2nd proximal toe that began about 1-2 months ago. Reports that pain has increased over the last 3 weeks.  Describes multiple instances that have contributed to pain: 1st incident occurred when she fell on her left knee and right wrist about two months ago.  2nd incident occurred in July when she twisted her ankle twice within a short amount of time. She notes that during both incidents above she'd been wearing heels or flats.  3rd incident occurred about a month ago when her boyfriend popped her left 2nd proximal toe. States that she experiences pain while walking, like her foot "will crunch apart". Reports that she has been elevating her foot at night, applying an ice pack, taking Aleve, decreasing her activity, and is wearing supportive shoes when possible.    Asthma/Allergies She is currently taking Singulair 10 mg and QVAR twice daily Feels well controlled Denies any significant concerns     Past Medical History:  Diagnosis Date   Amplified musculoskeletal pain    Anemia    Anxiety    Arthralgia    Fever, arthralgias,rash,abd pain, darrhiea, oral ulcers, and fatigue.   Asthma    Asthma    Autism    Behcet's syndrome (HCC) 2016   Possible   Bipolar 2 disorder (HCC)    per patient diagnosis found out to not be bipolar   Depression    Dysmenorrhea in adolescent    Eczema    History of proteinuria syndrome 2012   + hx of hematuria: nephrol w/u in [place] and [place] both normal.   History of vitamin D deficiency     Major depressive disorder, single episode, severe with psychotic features (HCC) 12/22/2015   Migraine with aura    Mouth ulcers    some vaginal also   Patellar subluxation, left, sequela 08/08/2016   Raynaud's phenomenon    Septic arthritis of hip (HCC) 2010     Social History   Tobacco Use   Smoking status: Never    Passive exposure: Never   Smokeless tobacco: Never  Vaping Use   Vaping status: Never Used  Substance Use Topics   Alcohol use: No    Alcohol/week: 0.0 standard drinks of alcohol   Drug use: No    Past Surgical History:  Procedure Laterality Date   APPENDECTOMY  2010   MRI L/S spine  2013   Possible bilateral pars defect at L5 without evidence of spondylolisthesis.  Otherwise normal.    Family History  Problem Relation Age of Onset   Cancer Mother    Cervical cancer Mother    Endometriosis Mother    Bipolar disorder Mother    Anxiety disorder Mother    Anxiety disorder Father    Thyroid disease Maternal Grandmother    Bipolar disorder Maternal Grandmother    Depression Maternal Grandmother    Prostate cancer Maternal Grandfather    Diabetes Maternal Grandfather  Heart disease Maternal Grandfather    Heart disease Paternal Grandmother    Rheumatic fever Paternal Grandmother    Stroke Paternal Grandmother    Depression Paternal Grandmother    Mental illness Paternal Grandmother        either bipolar or schizophrenia   Suicidality Paternal Grandmother        attempted   Anxiety disorder Maternal Aunt    Depression Maternal Aunt    Anxiety disorder Cousin    Depression Cousin    Suicidality Cousin    Suicidality Other    Suicidality Other    Diabetes Paternal Great-grandmother     Allergies  Allergen Reactions   Mushroom Extract Complex Hives and Rash   Cephalosporins Hives and Rash    Current Medications:   Current Outpatient Medications:    albuterol (PROVENTIL) (2.5 MG/3ML) 0.083% nebulizer solution, Take 3 mLs (2.5 mg total) by  nebulization every 6 (six) hours as needed for wheezing or shortness of breath., Disp: 150 mL, Rfl: 2   albuterol (VENTOLIN HFA) 108 (90 Base) MCG/ACT inhaler, INHALE 2 PUFFS INTO THE LUNGS EVERY 6 HOURS AS NEEDED FOR WHEEZING OR SHORTNESS OF BREATH, Disp: 6.7 g, Rfl: 0   cetirizine (ZYRTEC) 10 MG tablet, Take 10 mg by mouth daily., Disp: , Rfl:    colchicine 0.6 MG tablet, Take 0.6 mg by mouth 2 (two) times daily. , Disp: , Rfl:    JUNEL FE 1/20 1-20 MG-MCG tablet, Take 1 tablet by mouth daily., Disp: , Rfl:    beclomethasone (QVAR REDIHALER) 80 MCG/ACT inhaler, Inhale 1 puff into the lungs 2 (two) times daily., Disp: 1 each, Rfl: 2   hydrOXYzine (VISTARIL) 25 MG capsule, Take 1 capsule (25 mg total) by mouth every 8 (eight) hours as needed., Disp: 30 capsule, Rfl: 0   montelukast (SINGULAIR) 10 MG tablet, Take 1 tablet (10 mg total) by mouth at bedtime., Disp: 30 tablet, Rfl: 5   Review of Systems:   Review of Systems  Musculoskeletal:        (+) Foot pain (began 1 month ago, left dorsal foot diffuse to 2nd proximal toe, pain increased over past three weeks)    Vitals:   Vitals:   12/15/22 0824  BP: 110/76  Pulse: 79  Temp: (!) 97.5 F (36.4 C)  TempSrc: Temporal  SpO2: 99%  Weight: 254 lb 4 oz (115.3 kg)  Height: 5\' 3"  (1.6 m)     Body mass index is 45.04 kg/m.  Physical Exam:   Physical Exam Constitutional:      Appearance: Normal appearance. She is well-developed.  HENT:     Head: Normocephalic and atraumatic.  Eyes:     General: Lids are normal.     Extraocular Movements: Extraocular movements intact.     Conjunctiva/sclera: Conjunctivae normal.  Pulmonary:     Effort: Pulmonary effort is normal.  Musculoskeletal:        General: Normal range of motion.     Cervical back: Normal range of motion and neck supple.  Feet:     Comments: No tenderness to palpation to foot Normal range of motion  Skin:    General: Skin is warm and dry.  Neurological:     Mental  Status: She is alert and oriented to person, place, and time.  Psychiatric:        Attention and Perception: Attention and perception normal.        Mood and Affect: Mood normal.  Behavior: Behavior normal.        Thought Content: Thought content normal.        Judgment: Judgment normal.     Assessment and Plan:   Left foot pain Unclear etiology I did recommend that she focus on more supportive shoe wear Will obtain xray If no acute fracture or concerns - will send to physical therapy   Need for prophylactic vaccination with combined diphtheria-tetanus-pertussis (DTP) vaccine Updated today  Need for immunization against influenza Updated today  Mild intermittent asthma with acute exacerbation Overall well controlled Recommend continued management with singulair 10 mg daily and qvar twice daily Follow-up if new/worsening control/issues   I,Emily Lagle,acting as a scribe for [employer], PA.,have documented all relevant documentation on the behalf of Jarold Motto, PA,as directed by  Jarold Motto, PA while in the presence of Jarold Motto, [place].  I, Jarold Motto, [place], have reviewed all documentation for this visit. The documentation on 12/15/22 for the exam, diagnosis, procedures, and orders are all accurate and complete.  Jarold Motto, PA-C

## 2022-12-16 ENCOUNTER — Other Ambulatory Visit: Payer: Self-pay | Admitting: *Deleted

## 2022-12-16 MED ORDER — BUDESONIDE 90 MCG/ACT IN AEPB: 1.0000 | INHALATION_SPRAY | Freq: Two times a day (BID) | RESPIRATORY_TRACT | 0 refills | Status: DC

## 2022-12-19 NOTE — Progress Notes (Signed)
Molly Molina is a 22 y.o. female here for a new problem.  History of Present Illness:   No chief complaint on file.   HPI Abscess (Right Thigh): Complains of abscess on her right inner thigh that appeared *** ago  ***  ***  ***   Past Medical History:  Diagnosis Date   Amplified musculoskeletal pain    Anemia    Anxiety    Arthralgia    Fever, arthralgias,rash,abd pain, darrhiea, oral ulcers, and fatigue.   Asthma    Asthma    Autism    Behcet's syndrome (HCC) 2016   Possible   Bipolar 2 disorder (HCC)    per patient diagnosis found out to not be bipolar   Depression    Dysmenorrhea in adolescent    Eczema    History of proteinuria syndrome 2012   + hx of hematuria: nephrol w/u in [place] and [place] both normal.   History of vitamin D deficiency    Major depressive disorder, single episode, severe with psychotic features (HCC) 12/22/2015   Migraine with aura    Mouth ulcers    some vaginal also   Patellar subluxation, left, sequela 08/08/2016   Raynaud's phenomenon    Septic arthritis of hip (HCC) 2010     Social History   Tobacco Use   Smoking status: Never    Passive exposure: Never   Smokeless tobacco: Never  Vaping Use   Vaping status: Never Used  Substance Use Topics   Alcohol use: No    Alcohol/week: 0.0 standard drinks of alcohol   Drug use: No    Past Surgical History:  Procedure Laterality Date   APPENDECTOMY  2010   MRI L/S spine  2013   Possible bilateral pars defect at L5 without evidence of spondylolisthesis.  Otherwise normal.    Family History  Problem Relation Age of Onset   Cancer Mother    Cervical cancer Mother    Endometriosis Mother    Bipolar disorder Mother    Anxiety disorder Mother    Anxiety disorder Father    Thyroid disease Maternal Grandmother    Bipolar disorder Maternal Grandmother    Depression Maternal Grandmother    Prostate cancer Maternal Grandfather    Diabetes Maternal Grandfather    Heart  disease Maternal Grandfather    Heart disease Paternal Grandmother    Rheumatic fever Paternal Grandmother    Stroke Paternal Grandmother    Depression Paternal Grandmother    Mental illness Paternal Grandmother        either bipolar or schizophrenia   Suicidality Paternal Grandmother        attempted   Anxiety disorder Maternal Aunt    Depression Maternal Aunt    Anxiety disorder Cousin    Depression Cousin    Suicidality Cousin    Suicidality Other    Suicidality Other    Diabetes Paternal Great-grandmother     Allergies  Allergen Reactions   Mushroom Extract Complex Hives and Rash   Cephalosporins Hives and Rash    Current Medications:   Current Outpatient Medications:    albuterol (PROVENTIL) (2.5 MG/3ML) 0.083% nebulizer solution, Take 3 mLs (2.5 mg total) by nebulization every 6 (six) hours as needed for wheezing or shortness of breath., Disp: 150 mL, Rfl: 2   albuterol (VENTOLIN HFA) 108 (90 Base) MCG/ACT inhaler, INHALE 2 PUFFS INTO THE LUNGS EVERY 6 HOURS AS NEEDED FOR WHEEZING OR SHORTNESS OF BREATH, Disp: 6.7 g, Rfl: 0   Budesonide 90  MCG/ACT inhaler, Inhale 1 puff into the lungs 2 (two) times daily., Disp: 1 each, Rfl: 0   cetirizine (ZYRTEC) 10 MG tablet, Take 10 mg by mouth daily., Disp: , Rfl:    colchicine 0.6 MG tablet, Take 0.6 mg by mouth 2 (two) times daily. , Disp: , Rfl:    hydrOXYzine (VISTARIL) 25 MG capsule, Take 1 capsule (25 mg total) by mouth every 8 (eight) hours as needed., Disp: 30 capsule, Rfl: 0   JUNEL FE 1/20 1-20 MG-MCG tablet, Take 1 tablet by mouth daily., Disp: , Rfl:    montelukast (SINGULAIR) 10 MG tablet, Take 1 tablet (10 mg total) by mouth at bedtime., Disp: 30 tablet, Rfl: 5   Review of Systems:   ROS  Vitals:   There were no vitals filed for this visit.   There is no height or weight on file to calculate BMI.  Physical Exam:   Physical Exam  Assessment and Plan:   There are no diagnoses linked to this  encounter.         I,Emily Lagle,acting as a Neurosurgeon for [employer], PA.,have documented all relevant documentation on the behalf of Jarold Motto, PA,as directed by  Jarold Motto, PA while in the presence of Jarold Motto, [place].  *** (refresh reminder)  I, Larey Brick, have reviewed all documentation for this visit. The documentation on 12/19/22 for the exam, diagnosis, procedures, and orders are all accurate and complete.  Jarold Motto, PA-C

## 2022-12-20 ENCOUNTER — Ambulatory Visit: Payer: Managed Care, Other (non HMO) | Admitting: Physician Assistant

## 2022-12-20 ENCOUNTER — Encounter: Payer: Self-pay | Admitting: Physician Assistant

## 2022-12-20 VITALS — BP 110/80 | HR 76 | Temp 97.5°F | Ht 63.0 in | Wt 255.4 lb

## 2022-12-20 DIAGNOSIS — L732 Hidradenitis suppurativa: Secondary | ICD-10-CM

## 2022-12-20 MED ORDER — DOXYCYCLINE HYCLATE 100 MG PO TABS: 100.0000 mg | ORAL_TABLET | Freq: Two times a day (BID) | ORAL | 0 refills | Status: DC

## 2022-12-26 ENCOUNTER — Encounter: Payer: Self-pay | Admitting: Physician Assistant

## 2022-12-26 ENCOUNTER — Other Ambulatory Visit: Payer: Self-pay | Admitting: Physician Assistant

## 2022-12-26 DIAGNOSIS — Z1341 Encounter for autism screening: Secondary | ICD-10-CM

## 2022-12-28 ENCOUNTER — Other Ambulatory Visit: Payer: Self-pay | Admitting: *Deleted

## 2022-12-28 MED ORDER — PULMICORT FLEXHALER 90 MCG/ACT IN AEPB: 1.0000 | INHALATION_SPRAY | Freq: Two times a day (BID) | RESPIRATORY_TRACT | 0 refills | Status: AC

## 2023-02-17 ENCOUNTER — Ambulatory Visit: Payer: Managed Care, Other (non HMO) | Admitting: Family

## 2023-02-17 VITALS — BP 99/69 | HR 81 | Temp 98.0°F | Ht 63.0 in | Wt 264.1 lb

## 2023-02-17 DIAGNOSIS — J029 Acute pharyngitis, unspecified: Secondary | ICD-10-CM | POA: Diagnosis not present

## 2023-02-17 LAB — POCT RAPID STREP A (OFFICE): Rapid Strep A Screen: NEGATIVE

## 2023-02-17 NOTE — Progress Notes (Signed)
Patient ID: Molly Molina, female    DOB: 08-16-2000, 22 y.o.   MRN: [id number]  Chief Complaint  Patient presents with   Sinus Problem    Pt c/o Sore throat, cough with green mucus and Chest congestion. SX present for 4 days. Has tried mint tea and robitussin which did not help sx.    Discussed the use of AI scribe software for clinical note transcription with the patient, who gave verbal consent to proceed.  History of Present Illness   The patient, with a history of recurrent strep throat, bronchitis, and COVID-19, presents with a sore throat and congestion. She reports that her tonsils feel swollen and she is having difficulty swallowing. She also describes coughing up green phlegm and feeling congested, which is affecting her ability to breathe and swallow. The symptoms started on Tuesday of the current week.  The patient has been managing her symptoms at home with a humidifier, hot tea & honey, and over-the-counter Robitussin MD. However, she reports that these measures have not been effective. She also has a history of autoimmune disease (Behcet's) and has taken a low dose of zinc in past and vitamin D for immune support.  The patient also reports having a fever and has been wearing a mask at work to prevent spreading any potential infection. She has been trying to manage her symptoms at home with a focus on nutrition and hydration.     Assessment & Plan:     Upper Respiratory Infection - Symptoms started on Tuesday with sore throat, congestion, and coughing up green sputum. No strep throat detected. Patient has been self-treating with humidifier, hot tea, and Robitussin MD with minimal improvement. -Continue home remedies including humidifier, hot tea, and Robitussin MD. -Add zinc and vitamin C to boost immune system. -Consider using nasal saline spray several times a day to prevent a sinus infection. -If needed for pain, use OTC chloroceptic spray and ibuprofen up to 600mg  tid prn  for its anti-inflammatory properties. -Consider using Flonase or Nasacort twice a day for three days then once a day until symptoms improve (reports mother has extra bottle at home) -Also if needed for nasal congestion, consider using generic pseudoephedrine. -Expected improvement by Monday. If cough worsens or persists for a couple of weeks, seek medical attention.     Subjective:    Outpatient Medications Prior to Visit  Medication Sig Dispense Refill   albuterol (PROVENTIL) (2.5 MG/3ML) 0.083% nebulizer solution Take 3 mLs (2.5 mg total) by nebulization every 6 (six) hours as needed for wheezing or shortness of breath. 150 mL 2   albuterol (VENTOLIN HFA) 108 (90 Base) MCG/ACT inhaler INHALE 2 PUFFS INTO THE LUNGS EVERY 6 HOURS AS NEEDED FOR WHEEZING OR SHORTNESS OF BREATH 6.7 g 0   cetirizine (ZYRTEC) 10 MG tablet Take 10 mg by mouth daily.     colchicine 0.6 MG tablet Take 0.6 mg by mouth 2 (two) times daily.      hydrOXYzine (VISTARIL) 25 MG capsule Take 1 capsule (25 mg total) by mouth every 8 (eight) hours as needed. 30 capsule 0   JUNEL FE 1/20 1-20 MG-MCG tablet Take 1 tablet by mouth daily.     montelukast (SINGULAIR) 10 MG tablet Take 1 tablet (10 mg total) by mouth at bedtime. 30 tablet 5   PULMICORT FLEXHALER 90 MCG/ACT inhaler Inhale 1 puff into the lungs 2 (two) times daily. 1 each 0   doxycycline (VIBRA-TABS) 100 MG tablet Take 1 tablet (100 mg total)  by mouth 2 (two) times daily. (Patient not taking: Reported on 02/17/2023) 20 tablet 0   No facility-administered medications prior to visit.   Past Medical History:  Diagnosis Date   Amplified musculoskeletal pain    Anemia    Anxiety    Arthralgia    Fever, arthralgias,rash,abd pain, darrhiea, oral ulcers, and fatigue.   Asthma    Asthma    Autism    Behcet's syndrome (HCC) 2016   Possible   Bipolar 2 disorder (HCC)    per patient diagnosis found out to not be bipolar   Depression    Dysmenorrhea in adolescent     Eczema    History of proteinuria syndrome 2012   + hx of hematuria: nephrol w/u in [place] and [place] both normal.   History of vitamin D deficiency    Major depressive disorder, single episode, severe with psychotic features (HCC) 12/22/2015   Migraine with aura    Mouth ulcers    some vaginal also   Patellar subluxation, left, sequela 08/08/2016   Raynaud's phenomenon    Septic arthritis of hip (HCC) 2010   Past Surgical History:  Procedure Laterality Date   APPENDECTOMY  2010   MRI L/S spine  2013   Possible bilateral pars defect at L5 without evidence of spondylolisthesis.  Otherwise normal.   Allergies  Allergen Reactions   Mushroom Extract Complex Hives and Rash   Cephalosporins Hives and Rash      Objective:    Physical Exam Vitals and nursing note reviewed.  Constitutional:      Appearance: Normal appearance. She is ill-appearing.     Interventions: Face mask in place.  HENT:     Right Ear: Tympanic membrane and ear canal normal.     Left Ear: Tympanic membrane and ear canal normal.     Nose:     Right Sinus: No frontal sinus tenderness.     Left Sinus: No frontal sinus tenderness.     Mouth/Throat:     Mouth: Mucous membranes are moist.     Pharynx: No pharyngeal swelling, oropharyngeal exudate, posterior oropharyngeal erythema or uvula swelling.     Tonsils: No tonsillar exudate or tonsillar abscesses.  Cardiovascular:     Rate and Rhythm: Normal rate and regular rhythm.  Pulmonary:     Effort: Pulmonary effort is normal.     Breath sounds: Normal breath sounds.  Musculoskeletal:        General: Normal range of motion.  Lymphadenopathy:     Head:     Right side of head: No preauricular or posterior auricular adenopathy.     Left side of head: No preauricular or posterior auricular adenopathy.     Cervical: No cervical adenopathy.  Skin:    General: Skin is warm and dry.  Neurological:     Mental Status: She is alert.  Psychiatric:        Mood and  Affect: Mood normal.        Behavior: Behavior normal.    BP 99/69 (BP Location: Left Arm, Patient Position: Sitting, Cuff Size: Large)   Pulse 81   Temp 98 F (36.7 C) (Temporal)   Ht 5\' 3"  (1.6 m)   Wt 264 lb 2 oz (119.8 kg)   SpO2 98%   BMI 46.79 kg/m  Wt Readings from Last 3 Encounters:  02/17/23 264 lb 2 oz (119.8 kg)  12/20/22 255 lb 6.1 oz (115.8 kg)  12/15/22 254 lb 4 oz (115.3 kg)  Dulce Sellar, NP

## 2023-03-01 ENCOUNTER — Other Ambulatory Visit: Payer: Self-pay | Admitting: Physician Assistant

## 2023-03-01 MED ORDER — HYDROXYZINE PAMOATE 25 MG PO CAPS: 25.0000 mg | ORAL_CAPSULE | Freq: Three times a day (TID) | ORAL | 0 refills | Status: DC | PRN

## 2023-04-20 ENCOUNTER — Telehealth: Payer: Managed Care, Other (non HMO) | Admitting: Physician Assistant

## 2023-04-20 DIAGNOSIS — J069 Acute upper respiratory infection, unspecified: Secondary | ICD-10-CM

## 2023-04-20 DIAGNOSIS — B9689 Other specified bacterial agents as the cause of diseases classified elsewhere: Secondary | ICD-10-CM | POA: Diagnosis not present

## 2023-04-20 MED ORDER — BENZONATATE 100 MG PO CAPS: 100.0000 mg | ORAL_CAPSULE | Freq: Three times a day (TID) | ORAL | 0 refills | Status: DC | PRN

## 2023-04-20 MED ORDER — AZITHROMYCIN 250 MG PO TABS: ORAL_TABLET | ORAL | 0 refills | Status: AC

## 2023-04-20 NOTE — Patient Instructions (Signed)
[name], thank you for joining [name], PA-C for today's virtual visit.  While this provider is not your primary care provider (PCP), if your PCP is located in our provider database this encounter information will be shared with them immediately following your visit.   A [facility] MyChart account gives you access to today's visit and all your visits, tests, and labs performed at [facility] " click here if you don't have a [facility] MyChart account or go to [email or web address]  Consent: (Patient) [name] provided verbal consent for this virtual visit at the beginning of the encounter.  Current Medications:  Current Outpatient Medications:    albuterol (PROVENTIL) (2.5 MG/3ML) 0.083% nebulizer solution, Take 3 mLs (2.5 mg total) by nebulization every 6 (six) hours as needed for wheezing or shortness of breath., Disp: 150 mL, Rfl: 2   albuterol (VENTOLIN HFA) 108 (90 Base) MCG/ACT inhaler, INHALE 2 PUFFS INTO THE LUNGS EVERY 6 HOURS AS NEEDED FOR WHEEZING OR SHORTNESS OF BREATH, Disp: 6.7 g, Rfl: 0   cetirizine (ZYRTEC) 10 MG tablet, Take 10 mg by mouth daily., Disp: , Rfl:    colchicine 0.6 MG tablet, Take 0.6 mg by mouth 2 (two) times daily. , Disp: , Rfl:    doxycycline (VIBRA-TABS) 100 MG tablet, Take 1 tablet (100 mg total) by mouth 2 (two) times daily. (Patient not taking: Reported on 02/17/2023), Disp: 20 tablet, Rfl: 0   hydrOXYzine (VISTARIL) 25 MG capsule, Take 1 capsule (25 mg total) by mouth every 8 (eight) hours as needed., Disp: 30 capsule, Rfl: 0   JUNEL FE 1/20 1-20 MG-MCG tablet, Take 1 tablet by mouth daily., Disp: , Rfl:    montelukast (SINGULAIR) 10 MG tablet, Take 1 tablet (10 mg total) by mouth at bedtime., Disp: 30 tablet, Rfl: 5   PULMICORT FLEXHALER 90 MCG/ACT inhaler, Inhale 1 puff into the lungs 2 (two) times daily., Disp: 1 each, Rfl: 0   Medications ordered in this encounter:  No orders of the defined types were placed  in this encounter.    *If you need refills on other medications prior to your next appointment, please contact your pharmacy*  Follow-Up: Call back or seek an in-person evaluation if the symptoms worsen or if the condition fails to improve as anticipated.  [facility] [phone]  Other Instructions Take antibiotic (Azithromycin) as directed.  Increase fluids.  Get plenty of rest. Use Mucinex for congestion. Use the Tessalon as directed for cough. Take a daily probiotic (I recommend Align or Culturelle, but even Activia Yogurt may be beneficial).  A humidifier placed in the bedroom may offer some relief for a dry, scratchy throat of nasal irritation.  Read information below on acute bronchitis. Please call or return to clinic if symptoms are not improving.  Acute Bronchitis Bronchitis is when the airways that extend from the windpipe into the lungs get red, puffy, and painful (inflamed). Bronchitis often causes thick spit (mucus) to develop. This leads to a cough. A cough is the most common symptom of bronchitis. In acute bronchitis, the condition usually begins suddenly and goes away over time (usually in 2 weeks). Smoking, allergies, and asthma can make bronchitis worse. Repeated episodes of bronchitis may cause more lung problems.  HOME CARE Rest. Drink enough fluids to keep your pee (urine) clear or pale yellow (unless you need to limit fluids as told by your doctor). Only take over-the-counter or prescription medicines as told by your doctor. Avoid smoking and  secondhand smoke. These can make bronchitis worse. If you are a smoker, think about using nicotine gum or skin patches. Quitting smoking will help your lungs heal faster. Reduce the chance of getting bronchitis again by: Washing your hands often. Avoiding people with cold symptoms. Trying not to touch your hands to your mouth, nose, or eyes. Follow up with your doctor as told.  GET HELP IF: Your symptoms do not  improve after 1 week of treatment. Symptoms include: Cough. Fever. Coughing up thick spit. Body aches. Chest congestion. Chills. Shortness of breath. Sore throat.  GET HELP RIGHT AWAY IF:  You have an increased fever. You have chills. You have severe shortness of breath. You have bloody thick spit (sputum). You throw up (vomit) often. You lose too much body fluid (dehydration). You have a severe headache. You faint.  MAKE SURE YOU:  Understand these instructions. Will watch your condition. Will get help right away if you are not doing well or get worse. Document Released: 09/07/2007 Document Revised: 11/21/2012 Document Reviewed: 09/11/2012 [facility] Patient Information 2015 [place], [place]. This information is not intended to replace advice given to you by your health care provider. Make sure you discuss any questions you have with your health care provider.    If you have been instructed to have an in-person evaluation today at a local Urgent Care facility, please use the link below. It will take you to a list of all of our available [facility], including address, phone number and hours of operation. Please do not delay care.  [facility]  If you or a family member do not have a primary care provider, use the link below to schedule a visit and establish care. When you choose a [facility] primary care physician or advanced practice provider, you gain a long-term partner in health. Find a Primary Care Provider  Learn more about [facility]'s in-office and virtual care options: [facility] - Get Care Now

## 2023-04-20 NOTE — Progress Notes (Signed)
Virtual Visit Consent   Molly Molina, you are scheduled for a virtual visit with a [facility] provider today. Just as with appointments in the office, your consent must be obtained to participate. Your consent will be active for this visit and any virtual visit you may have with one of our providers in the next 365 days. If you have a MyChart account, a copy of this consent can be sent to you electronically.  As this is a virtual visit, video technology does not allow for your provider to perform a traditional examination. This may limit your provider's ability to fully assess your condition. If your provider identifies any concerns that need to be evaluated in person or the need to arrange testing (such as labs, EKG, etc.), we will make arrangements to do so. Although advances in technology are sophisticated, we cannot ensure that it will always work on either your end or our end. If the connection with a video visit is poor, the visit may have to be switched to a telephone visit. With either a video or telephone visit, we are not always able to ensure that we have a secure connection.  By engaging in this virtual visit, you consent to the provision of healthcare and authorize for your insurance to be billed (if applicable) for the services provided during this visit. Depending on your insurance coverage, you may receive a charge related to this service.  I need to obtain your verbal consent now. Are you willing to proceed with your visit today? Molly Molina has provided verbal consent on 04/20/2023 for a virtual visit (video or telephone). Molly Molina, [place]  Date: 04/20/2023 7:53 AM  Virtual Visit via Video Note   I, Molly Molina, connected with  Molly Molina  ([id number], 25-Oct-2000) on 04/20/23 at  7:45 AM EST by a video-enabled telemedicine application and verified that I am speaking with the correct person using two identifiers.  Location: Patient: Virtual Visit Location  Patient: Home Provider: Virtual Visit Location Provider: Home Office   I discussed the limitations of evaluation and management by telemedicine and the availability of in person appointments. The patient expressed understanding and agreed to proceed.    History of Present Illness: Molly Molina is a 23 y.o. who identifies as a female who was assigned female at birth, and is being seen today for 3 days of substantial sore throat with chest congestion and cough that is productive of thick green phlegm. Now with L sided sinus and facial pain with sinus congestion. 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  Is staying hydrated and using her [employer].  Also taking Mucinex  HPI: HPI  Problems:  Patient Active Problem List   Diagnosis Date Noted   Dyspepsia 07/14/2017   Subluxation of left patella 08/08/2016   Left knee pain 06/21/2016   Raynaud's phenomenon    Eczema    Dysmenorrhea in adolescent    Asthma    Amplified musculoskeletal pain    Severe anxiety with panic 01/14/2016   Irregular menses 04/15/2015   Recurrent mouth ulceration 12/15/2014   Arthralgia 12/10/2014   Behcet's syndrome (HCC) 04/04/2014   History of proteinuria syndrome 04/04/2010    Allergies:  Allergies  Allergen Reactions   Mushroom Extract Complex (Do Not Select) Hives and Rash   Cephalosporins Hives and Rash   Medications:  Current Outpatient Medications:    azithromycin (ZITHROMAX) 250 MG tablet, Take 2 tablets on day 1, then 1 tablet daily on days 2 through 5, Disp: 6 tablet, Rfl: 0   benzonatate (TESSALON) 100 MG capsule, Take 1 capsule (100 mg total) by mouth 3 (three) times daily as needed for cough., Disp: 30 capsule, Rfl: 0   albuterol (PROVENTIL) (2.5 MG/3ML) 0.083% nebulizer solution, Take 3 mLs (2.5 mg total) by nebulization every 6 (six) hours as needed for wheezing or  shortness of breath., Disp: 150 mL, Rfl: 2   albuterol (VENTOLIN HFA) 108 (90 Base) MCG/ACT inhaler, INHALE 2 PUFFS INTO THE LUNGS EVERY 6 HOURS AS NEEDED FOR WHEEZING OR SHORTNESS OF BREATH, Disp: 6.7 g, Rfl: 0   cetirizine (ZYRTEC) 10 MG tablet, Take 10 mg by mouth daily., Disp: , Rfl:    colchicine 0.6 MG tablet, Take 0.6 mg by mouth 2 (two) times daily. , Disp: , Rfl:    hydrOXYzine (VISTARIL) 25 MG capsule, Take 1 capsule (25 mg total) by mouth every 8 (eight) hours as needed., Disp: 30 capsule, Rfl: 0   JUNEL FE 1/20 1-20 MG-MCG tablet, Take 1 tablet by mouth daily., Disp: , Rfl:    montelukast (SINGULAIR) 10 MG tablet, Take 1 tablet (10 mg total) by mouth at bedtime., Disp: 30 tablet, Rfl: 5   PULMICORT FLEXHALER 90 MCG/ACT inhaler, Inhale 1 puff into the lungs 2 (two) times daily., Disp: 1 each, Rfl: 0  Observations/Objective: Patient is well-developed, well-nourished in no acute distress.  Resting comfortably at home.  Head is normocephalic, atraumatic.  No labored breathing. Speech is clear and coherent with logical content.  Patient is alert and oriented at baseline.   Assessment and Plan: 1. Bacterial URI (Primary) - benzonatate (TESSALON) 100 MG capsule; Take 1 capsule (100 mg total) by mouth 3 (three) times daily as needed for cough.  Dispense: 30 capsule; Refill: 0 - azithromycin (ZITHROMAX) 250 MG tablet; Take 2 tablets on day 1, then 1 tablet daily on days 2 through 5  Dispense: 6 tablet; Refill: 0  Rx Azithromycin.  Increase fluids.  Rest.  Saline nasal spray.  Probiotic.  Mucinex as directed.  Humidifier in bedroom. Tessalon per orders.  Call or return to clinic if symptoms are not improving.   Follow Up Instructions: I discussed the assessment and treatment plan with the patient. The patient was provided an opportunity to ask questions and all were answered. The patient agreed with the plan and demonstrated an understanding of the instructions.  A copy of instructions  were sent to the patient via MyChart unless otherwise noted below.   The patient was advised to call back or seek an in-person evaluation if the symptoms worsen or if the condition fails to improve as anticipated.    Molly Climes, PA-C

## 2023-05-22 ENCOUNTER — Ambulatory Visit (INDEPENDENT_AMBULATORY_CARE_PROVIDER_SITE_OTHER): Payer: Managed Care, Other (non HMO) | Admitting: Dermatology

## 2023-05-22 VITALS — BP 115/79

## 2023-05-22 DIAGNOSIS — L732 Hidradenitis suppurativa: Secondary | ICD-10-CM

## 2023-05-22 MED ORDER — DOXYCYCLINE HYCLATE 100 MG PO TABS: 100.0000 mg | ORAL_TABLET | Freq: Two times a day (BID) | ORAL | 3 refills | Status: DC

## 2023-05-22 MED ORDER — CLINDAMYCIN PHOSPHATE 1 % EX LOTN: TOPICAL_LOTION | Freq: Every day | CUTANEOUS | 3 refills | Status: AC

## 2023-05-22 MED ORDER — MUPIROCIN 2 % EX OINT: 1.0000 | TOPICAL_OINTMENT | Freq: Two times a day (BID) | CUTANEOUS | 3 refills | Status: DC

## 2023-05-22 NOTE — Progress Notes (Signed)
   New Patient Visit   Subjective  Molly Molina is a 23 y.o. female who presents for the following: bumps of inner thighs, bikini line and posterior upper thighs. She is not treating with anything right now. She has used doxycycline in the past for flares. She had seen Mackey Birchwood, PA about 2 years ago and she tried to get Humira approved but it was denied by her insurance.  The following portions of the chart were reviewed this encounter and updated as appropriate: medications, allergies, medical history  Review of Systems:  No other skin or systemic complaints except as noted in HPI or Assessment and Plan.  Objective  Well appearing patient in no apparent distress; mood and affect are within normal limits.  A focused examination was performed of the following areas: Legs, groin area, buttocks  Relevant exam findings are noted in the Assessment and Plan.    Assessment & Plan   HIDRADENITIS SUPPURATIVA   Related Procedures Comprehensive metabolic panel CBC with Differential/Platelet Hepatitis B surface antibody,qualitative Hepatitis B surface antigen Hepatitis C antibody QuantiFERON-TB Gold Plus Lipid Panel  HIDRADENITIS SUPPURATIVA Exam: [place] Stage 1: abscess formation (single or multiple) without sinus tracts or scarring   Hidradenitis Suppurativa is a chronic; persistent; non-curable, but treatable condition due to abnormal inflamed sweat glands in the body folds (axilla, inframammary, groin, medial thighs), causing recurrent painful draining cysts and scarring. It can be associated with severe scarring acne and cysts; also abscesses and scarring of scalp. The goal is control and prevention of flares, as it is not curable. Scars are permanent and can be thickened. Treatment may include daily use of topical medication and oral antibiotics.  Oral isotretinoin may also be helpful.  For some cases, Humira or Cosentyx (biologic injections) may be prescribed to decrease the  inflammatory process and prevent flares.  When indicated, inflamed cysts may also be treated surgically.  Treatment Plan:  Recommend Cerave Acne foaming wash Clindamycin lotion Apply daily to affected areas.  Doxycycline 100 1 tablet twice daily with food and plenty of fluid x 5 days as needed for flares. Mupirocin ointment twice daily to affected areas as needed for flares.  We will order labs today. If labs okay, may consider Bimzelx on follow up.  Return in about 4 months (around 09/19/2023) for HS Follow up.  I, Joanie Coddington, CMA, am acting as scribe for [employer], DO .   Documentation: I have reviewed the above documentation for accuracy and completeness, and I agree with the above.  Langston Reusing, DO

## 2023-05-22 NOTE — Patient Instructions (Addendum)
Hello Molly Molina,  Thank you for visiting [place] today.   Below are the key instructions from today's consultation:  Laboratory Tests: Prior to initiating Bimzelx, it is necessary to complete laboratory tests for TB, hepatitis, and a general blood work panel.  Current Medications:   Topical Regimen: Utilize CeraVe Acne Foaming Face Wash daily on the affected areas. Additionally, apply clindamycin lotion daily, with a focus on the inner thighs.   Oral Antibiotic: Doxycycline 100 mg should be taken orally, one tablet twice a day for 5 days at the onset of a flare. Ensure to take it with food.   For Severe Flares: Mupirocin ointment should be applied to open, pus-filled areas.  Lifestyle Changes:   To aid in reducing flare-ups, avoid carbohydrates, sugar, and dairy.   For severe flares, consider taking Epsom salt baths.  Follow-Up: A follow-up appointment is scheduled in 4 months to assess the effectiveness of this regimen. Based on the evaluation, we may consider commencing injectable treatment.  Should you have any questions or concerns before our next meeting, please do not hesitate to reach out via MyChart.  Warm regards,  Dr. [facility]        Important Information  Due to recent changes in healthcare laws, you may see results of your pathology and/or laboratory studies on MyChart before the doctors have had a chance to review them. We understand that in some cases there may be results that are confusing or concerning to you. Please understand that not all results are received at the same time and often the doctors may need to interpret multiple results in order to provide you with the best plan of care or course of treatment. Therefore, we ask that you please give [place] 2 business days to thoroughly review all your results before contacting the office for clarification. Should we see a critical lab result, you will be contacted sooner.   If You Need Anything After Your  Visit  If you have any questions or concerns for your doctor, please call our main line at [phone] If no one answers, please leave a voicemail as directed and we will return your call as soon as possible. Messages left after 4 pm will be answered the following business day.   You may also send [place] a message via MyChart. We typically respond to MyChart messages within 1-2 business days.  For prescription refills, please ask your pharmacy to contact our office. Our fax number is [phone].  If you have an urgent issue when the clinic is closed that cannot wait until the next business day, you can page your doctor at the number below.    Please note that while we do our best to be available for urgent issues outside of office hours, we are not available 24/7.   If you have an urgent issue and are unable to reach [place], you may choose to seek medical care at your doctor's office, retail clinic, urgent care center, or emergency room.  If you have a medical emergency, please immediately call 911 or go to the emergency department. In the event of inclement weather, please call our main line at [phone] for an update on the status of any delays or closures.  Dermatology Medication Tips: Please keep the boxes that topical medications come in in order to help keep track of the instructions about where and how to use these. Pharmacies typically print the medication instructions only on the boxes and not directly on the medication tubes.  If your medication is too expensive, please contact our office at [phone] or send [place] a message through MyChart.   We are unable to tell what your co-pay for medications will be in advance as this is different depending on your insurance coverage. However, we may be able to find a substitute medication at lower cost or fill out paperwork to get insurance to cover a needed medication.   If a prior authorization is required to get your medication covered by  your insurance company, please allow [place] 1-2 business days to complete this process.  Drug prices often vary depending on where the prescription is filled and some pharmacies may offer cheaper prices.  The website www.goodrx.com contains coupons for medications through different pharmacies. The prices here do not account for what the cost may be with help from insurance (it may be cheaper with your insurance), but the website can give you the price if you did not use any insurance.  - You can print the associated coupon and take it with your prescription to the pharmacy.  - You may also stop by our office during regular business hours and pick up a GoodRx coupon card.  - If you need your prescription sent electronically to a different pharmacy, notify our office through [facility] or by phone at [phone]

## 2023-05-28 ENCOUNTER — Encounter: Payer: Self-pay | Admitting: Dermatology

## 2023-06-20 ENCOUNTER — Telehealth

## 2023-06-21 ENCOUNTER — Telehealth: Admitting: Physician Assistant

## 2023-06-21 DIAGNOSIS — R6889 Other general symptoms and signs: Secondary | ICD-10-CM

## 2023-06-21 MED ORDER — OSELTAMIVIR PHOSPHATE 75 MG PO CAPS: 75.0000 mg | ORAL_CAPSULE | Freq: Two times a day (BID) | ORAL | 0 refills | Status: AC

## 2023-06-21 MED ORDER — BENZONATATE 100 MG PO CAPS
100.0000 mg | ORAL_CAPSULE | Freq: Three times a day (TID) | ORAL | 0 refills | Status: DC | PRN
Start: 2023-06-21 — End: 2023-07-10

## 2023-06-21 NOTE — Patient Instructions (Signed)
 Molly Molina, thank you for joining [name], PA-C for today's virtual visit.  While this provider is not your primary care provider (PCP), if your PCP is located in our provider database this encounter information will be shared with them immediately following your visit.   A [facility] MyChart account gives you access to today's visit and all your visits, tests, and labs performed at [facility] " click here if you don't have a [facility] MyChart account or go to [email or web address]  Consent: (Patient) Molly Molina provided verbal consent for this virtual visit at the beginning of the encounter.  Current Medications:  Current Outpatient Medications:    albuterol (PROVENTIL) (2.5 MG/3ML) 0.083% nebulizer solution, Take 3 mLs (2.5 mg total) by nebulization every 6 (six) hours as needed for wheezing or shortness of breath., Disp: 150 mL, Rfl: 2   albuterol (VENTOLIN HFA) 108 (90 Base) MCG/ACT inhaler, INHALE 2 PUFFS INTO THE LUNGS EVERY 6 HOURS AS NEEDED FOR WHEEZING OR SHORTNESS OF BREATH, Disp: 6.7 g, Rfl: 0   benzonatate (TESSALON) 100 MG capsule, Take 1 capsule (100 mg total) by mouth 3 (three) times daily as needed for cough., Disp: 30 capsule, Rfl: 0   cetirizine (ZYRTEC) 10 MG tablet, Take 10 mg by mouth daily., Disp: , Rfl:    clindamycin (CLEOCIN-T) 1 % lotion, Apply topically daily. Apply to affected areas  daily, Disp: 60 mL, Rfl: 3   colchicine 0.6 MG tablet, Take 0.6 mg by mouth 2 (two) times daily. , Disp: , Rfl:    doxycycline (VIBRA-TABS) 100 MG tablet, Take 1 tablet (100 mg total) by mouth 2 (two) times daily. Take 1 tablet twice daily for 5 days with food and plenty of fluid as needed for flares., Disp: 30 tablet, Rfl: 3   hydrOXYzine (VISTARIL) 25 MG capsule, Take 1 capsule (25 mg total) by mouth every 8 (eight) hours as needed., Disp: 30 capsule, Rfl: 0   JUNEL FE 1/20 1-20 MG-MCG tablet, Take 1 tablet by mouth daily., Disp: , Rfl:     montelukast (SINGULAIR) 10 MG tablet, Take 1 tablet (10 mg total) by mouth at bedtime., Disp: 30 tablet, Rfl: 5   mupirocin ointment (BACTROBAN) 2 %, Apply 1 Application topically 2 (two) times daily. As needed for flares, Disp: 22 g, Rfl: 3   PULMICORT FLEXHALER 90 MCG/ACT inhaler, Inhale 1 puff into the lungs 2 (two) times daily., Disp: 1 each, Rfl: 0   Medications ordered in this encounter:  No orders of the defined types were placed in this encounter.    *If you need refills on other medications prior to your next appointment, please contact your pharmacy*  Follow-Up: Call back or seek an in-person evaluation if the symptoms worsen or if the condition fails to improve as anticipated.  [facility] [phone]  Other Instructions Please keep well-hydrated and try to get plenty of rest. If you have a humidifier, place it in the bedroom and run it at night. Start a saline nasal rinse for nasal congestion. You can consider use of a nasal steroid spray like Flonase or Nasacort OTC. You can alternate between Tylenol and Ibuprofen if needed for fever, body aches, headache and/or throat pain. Salt water-gargles and chloraseptic spray can be very beneficial for sore throat. Mucinex-DM for congestion or cough. Please take all prescribed medications as directed.  Remain out of work until [employer] for 24 hours without a fever-reducing medication, and you are feeling better.  You should mask  until symptoms are resolved.  If anything worsens despite treatment, you need to be evaluated in-person. Please do not delay care.  Influenza, Adult Influenza is also called "the flu." It is an infection in the lungs, nose, and throat (respiratory tract). It spreads easily from person to person (is contagious). The flu causes symptoms that are like a cold, along with high fever and body aches. What are the causes? This condition is caused by the influenza virus. You can get the virus  by: Breathing in droplets that are in the air after a person infected with the flu coughed or sneezed. Touching something that has the virus on it and then touching your mouth, nose, or eyes. What increases the risk? Certain things may make you more likely to get the flu. These include: Not washing your hands often. Having close contact with many people during cold and flu season. Touching your mouth, eyes, or nose without first washing your hands. Not getting a flu shot every year. You may have a higher risk for the flu, and serious problems, such as a lung infection (pneumonia), if you: Are older than 65. Are pregnant. Have a weakened disease-fighting system (immune system) because of a disease or because you are taking certain medicines. Have a long-term (chronic) condition, such as: Heart, kidney, or lung disease. Diabetes. Asthma. Have a liver disorder. Are very overweight (morbidly obese). Have anemia. What are the signs or symptoms? Symptoms usually begin suddenly and last 4-14 days. They may include: Fever and chills. Headaches, body aches, or muscle aches. Sore throat. Cough. Runny or stuffy (congested) nose. Feeling discomfort in your chest. Not wanting to eat as much as normal. Feeling weak or tired. Feeling dizzy. Feeling sick to your stomach or throwing up. How is this treated? If the flu is found early, you can be treated with antiviral medicine. This can help to reduce how bad the illness is and how long it lasts. This may be given by mouth or through an IV tube. Taking care of yourself at home can help your symptoms get better. Your doctor may want you to: Take over-the-counter medicines. Drink plenty of fluids. The flu often goes away on its own. If you have very bad symptoms or other problems, you may be treated in a hospital. Follow these instructions at home:     Activity Rest as needed. Get plenty of sleep. Stay home from work or school as told by your  doctor. Do not leave home until you do not have a fever for 24 hours without taking medicine. Leave home only to go to your doctor. Eating and drinking Take an ORS (oral rehydration solution). This is a drink that is sold at pharmacies and stores. Drink enough fluid to keep your pee pale yellow. Drink clear fluids in small amounts as you are able. Clear fluids include: Water. Ice chips. Fruit juice mixed with water. Low-calorie sports drinks. Eat bland foods that are easy to digest. Eat small amounts as you are able. These foods include: Bananas. Applesauce. Rice. Lean meats. Toast. Crackers. Do not eat or drink: Fluids that have a Molina of sugar or caffeine. Alcohol. Spicy or fatty foods. General instructions Take over-the-counter and prescription medicines only as told by your doctor. Use a cool mist humidifier to add moisture to the air in your home. This can make it easier for you to breathe. When using a cool mist humidifier, clean it daily. Empty water and replace with clean water. Cover your mouth and  nose when you cough or sneeze. Wash your hands with soap and water often and for at least 20 seconds. This is also important after you cough or sneeze. If you cannot use soap and water, use alcohol-based hand sanitizer. Keep all follow-up visits. How is this prevented?  Get a flu shot every year. You may get the flu shot in late summer, fall, or winter. Ask your doctor when you should get your flu shot. Avoid contact with people who are sick during fall and winter. This is cold and flu season. Contact a doctor if: You get new symptoms. You have: Chest pain. Watery poop (diarrhea). A fever. Your cough gets worse. You start to have more mucus. You feel sick to your stomach. You throw up. Get help right away if you: Have shortness of breath. Have trouble breathing. Have skin or nails that turn a bluish color. Have very bad pain or stiffness in your neck. Get a sudden  headache. Get sudden pain in your face or ear. Cannot eat or drink without throwing up. These symptoms may represent a serious problem that is an emergency. Get medical help right away. Call your local emergency services (911 in the [place] Do not wait to see if the symptoms will go away. Do not drive yourself to the hospital. Summary Influenza is also called "the flu." It is an infection in the lungs, nose, and throat. It spreads easily from person to person. Take over-the-counter and prescription medicines only as told by your doctor. Getting a flu shot every year is the best way to not get the flu. This information is not intended to replace advice given to you by your health care provider. Make sure you discuss any questions you have with your health care provider. Document Revised: 11/08/2019 Document Reviewed: 11/08/2019 Elsevier Patient Education  2023 Elsevier Inc.      If you have been instructed to have an in-person evaluation today at a local Urgent Care facility, please use the link below. It will take you to a list of all of our available [facility], including address, phone number and hours of operation. Please do not delay care.  [facility]  If you or a family member do not have a primary care provider, use the link below to schedule a visit and establish care. When you choose a [facility] primary care physician or advanced practice provider, you gain a long-term partner in health. Find a Primary Care Provider  Learn more about [facility]'s in-office and virtual care options: [facility] - Get Care Now

## 2023-06-21 NOTE — Progress Notes (Signed)
 Virtual Visit Consent   Molly Molina, you are scheduled for a virtual visit with a [facility] provider today. Just as with appointments in the office, your consent must be obtained to participate. Your consent will be active for this visit and any virtual visit you may have with one of our providers in the next 365 days. If you have a MyChart account, a copy of this consent can be sent to you electronically.  As this is a virtual visit, video technology does not allow for your provider to perform a traditional examination. This may limit your provider's ability to fully assess your condition. If your provider identifies any concerns that need to be shoulevaluated in person or the need to arrange testing (such as labs, EKG, etc.), we will make arrangements to do so. Although advances in technology are sophisticated, we cannot ensure that it will always work on either your end or our end. If the connection with a video visit is poor, the visit may have to be switched to a telephone visit. With either a video or telephone visit, we are not always able to ensure that we have a secure connection.  By engaging in this virtual visit, you consent to the provision of healthcare and authorize for your insurance to be billed (if applicable) for the services provided during this visit. Depending on your insurance coverage, you may receive a charge related to this service.  I need to obtain your verbal consent now. Are you willing to proceed with your visit today? Molly Molina has provided verbal consent on 06/21/2023 for a virtual visit (video or telephone). [name], [place]  Date: 06/21/2023 9:37 AM   Virtual Visit via Video Note   I, [name], connected with  Molly Molina  ([id number][birth year]) on 06/21/23 at  9:30 AM EDT by a video-enabled telemedicine application and verified that I am speaking with the correct person using two identifiers.  Location: Patient: Virtual Visit  Location Patient: Home Provider: Virtual Visit Location Provider: Home Office   I discussed the limitations of evaluation and management by telemedicine and the availability of in person appointments. The patient expressed understanding and agreed to proceed.    History of Present Illness: Molly Molina is a 23 y.o. who identifies as a female who was assigned female at birth, and is being seen today for 1.5 days of severe sore throat (L/R), along with body aches, nasal congestion and L ear with pressure and muffled hearing. Notes fever yesterday at times, but was coming and going. Fiance is also sick with similar symptoms.   OTC -- Nothing.  HPI: HPI  Problems:  Patient Active Problem List   Diagnosis Date Noted   Dyspepsia 07/14/2017   Subluxation of left patella 08/08/2016   Left knee pain 06/21/2016   Raynaud's phenomenon    Eczema    Dysmenorrhea in adolescent    Asthma    Amplified musculoskeletal pain    Severe anxiety with panic 01/14/2016   Irregular menses 04/15/2015   Recurrent mouth ulceration 12/15/2014   Arthralgia 12/10/2014   Behcet's syndrome (HCC) 04/04/2014   History of proteinuria syndrome 04/04/2010    Allergies:  Allergies  Allergen Reactions   Mushroom Extract Complex (Obsolete) Hives and Rash   Cephalosporins Hives and Rash   Medications:  Current Outpatient Medications:    benzonatate (TESSALON) 100 MG capsule, Take 1 capsule (100 mg total) by mouth 3 (three) times daily as needed for cough., Disp: 30 capsule, Rfl: 0  oseltamivir (TAMIFLU) 75 MG capsule, Take 1 capsule (75 mg total) by mouth 2 (two) times daily for 5 days., Disp: 10 capsule, Rfl: 0   albuterol (PROVENTIL) (2.5 MG/3ML) 0.083% nebulizer solution, Take 3 mLs (2.5 mg total) by nebulization every 6 (six) hours as needed for wheezing or shortness of breath., Disp: 150 mL, Rfl: 2   albuterol (VENTOLIN HFA) 108 (90 Base) MCG/ACT inhaler, INHALE 2 PUFFS INTO THE LUNGS EVERY 6 HOURS AS NEEDED  FOR WHEEZING OR SHORTNESS OF BREATH, Disp: 6.7 g, Rfl: 0   cetirizine (ZYRTEC) 10 MG tablet, Take 10 mg by mouth daily., Disp: , Rfl:    clindamycin (CLEOCIN-T) 1 % lotion, Apply topically daily. Apply to affected areas  daily, Disp: 60 mL, Rfl: 3   colchicine 0.6 MG tablet, Take 0.6 mg by mouth 2 (two) times daily. , Disp: , Rfl:    hydrOXYzine (VISTARIL) 25 MG capsule, Take 1 capsule (25 mg total) by mouth every 8 (eight) hours as needed., Disp: 30 capsule, Rfl: 0   JUNEL FE 1/20 1-20 MG-MCG tablet, Take 1 tablet by mouth daily., Disp: , Rfl:    montelukast (SINGULAIR) 10 MG tablet, Take 1 tablet (10 mg total) by mouth at bedtime., Disp: 30 tablet, Rfl: 5   mupirocin ointment (BACTROBAN) 2 %, Apply 1 Application topically 2 (two) times daily. As needed for flares, Disp: 22 g, Rfl: 3   PULMICORT FLEXHALER 90 MCG/ACT inhaler, Inhale 1 puff into the lungs 2 (two) times daily., Disp: 1 each, Rfl: 0  Observations/Objective: Patient is well-developed, well-nourished in no acute distress.  Resting comfortably  at home.  Head is normocephalic, atraumatic.  No labored breathing.  Speech is clear and coherent with logical content.  Patient is alert and oriented at baseline.   Assessment and Plan: 1. Flu-like symptoms (Primary) - oseltamivir (TAMIFLU) 75 MG capsule; Take 1 capsule (75 mg total) by mouth 2 (two) times daily for 5 days.  Dispense: 10 capsule; Refill: 0 - benzonatate (TESSALON) 100 MG capsule; Take 1 capsule (100 mg total) by mouth 3 (three) times daily as needed for cough.  Dispense: 30 capsule; Refill: 0  Negative COVID. Classic influenza symptoms. Supportive measures, OTC medications and Vitamin recommendations reviewed. Will start Tamiflu per orders. Tessalon per orders. Quarantine reviewed with patient.    Follow Up Instructions: I discussed the assessment and treatment plan with the patient. The patient was provided an opportunity to ask questions and all were answered. The  patient agreed with the plan and demonstrated an understanding of the instructions.  A copy of instructions were sent to the patient via MyChart unless otherwise noted below.   The patient was advised to call back or seek an in-person evaluation if the symptoms worsen or if the condition fails to improve as anticipated.    [name], PA-C

## 2023-07-10 ENCOUNTER — Encounter: Payer: Self-pay | Admitting: Family Medicine

## 2023-07-10 ENCOUNTER — Ambulatory Visit: Admitting: Family Medicine

## 2023-07-10 ENCOUNTER — Ambulatory Visit (INDEPENDENT_AMBULATORY_CARE_PROVIDER_SITE_OTHER)
Admission: RE | Admit: 2023-07-10 | Discharge: 2023-07-10 | Disposition: A | Discharge status: Home / Self Care | Source: Ambulatory Visit | Attending: Family Medicine | Admitting: Family Medicine

## 2023-07-10 VITALS — BP 104/70 | HR 88 | Temp 98.1°F | Ht 63.0 in | Wt 268.0 lb

## 2023-07-10 DIAGNOSIS — M25562 Pain in left knee: Secondary | ICD-10-CM

## 2023-07-10 DIAGNOSIS — S83005A Unspecified dislocation of left patella, initial encounter: Secondary | ICD-10-CM | POA: Diagnosis not present

## 2023-07-10 DIAGNOSIS — M2202 Recurrent dislocation of patella, left knee: Secondary | ICD-10-CM | POA: Diagnosis not present

## 2023-07-10 DIAGNOSIS — M357 Hypermobility syndrome: Secondary | ICD-10-CM | POA: Diagnosis not present

## 2023-07-10 NOTE — Progress Notes (Unsigned)
 Kyanne Rials T. Bertha Lokken, MD, CAQ Sports Medicine [facility] [address] [place], [address]  Phone: [phone]  FAX: [phone]  Molly Molina - 22 y.o. female  MRN [id number]  Date of Birth: 2001/04/03  Date: 07/10/2023  PCP: Jarold Motto, PA  Referral: Jarold Motto, PA  Chief Complaint  Patient presents with   Knee Pain    Left Patella dislocated last night but popped back in   Subjective:   Molly Molina is a 23 y.o. very pleasant female patient with Body mass index is 47.47 kg/m. who presents with the following:  Patient presents for evaluation of acute left patellar dislocation.  Date of injury July 10, 2023.  She has a remarkable history for multiple and repeated dislocation and subluxations.  In 2025 alone, she has had 3 different patellar dislocations.  Yesterday, the patella was dislocated for roughly 30 seconds.  She was able to extend the knee and manipulate the patella with a pressure laterally in the medial direction and she was able to get this to reduce on its own.  Right now she is having a lot of knee pain, minor swelling, and she is walking with a limp.  Her first initial patellar dislocation was at age 20.  At that time, she did wear a knee immobilizer and she did do some physical therapy.  She also notes that she has been gaining a considerable amount of weight.  She also regularly does Pilates, and she walks for exercise.  She does note that she has some increased hypermobility and has known for an extended period of time that she is " double-jointed."  Review of Systems is noted in the HPI, as appropriate  Objective:   BP 104/70 (BP Location: Right Arm, Patient Position: Sitting, Cuff Size: Large)   Pulse 88   Temp 98.1 F (36.7 C) (Temporal)   Ht 5\' 3"  (1.6 m)   Wt 268 lb (121.6 kg)   LMP 06/20/2023   SpO2 99%   BMI 47.47 kg/m   Weight in (lb) to have BMI = 25: 140.8   GEN: No acute  distress; alert,appropriate. PULM: Breathing comfortably in no respiratory distress PSYCH: Normally interactive.   Hypermobility at the elbows bilaterally, knees bilaterally, she is able to put her hands flat on the floor. Beighton score is 5/9  Left knee: She does have significant tenderness with palpation of the medial lateral patella.  I did not test the hypertension or patellar mobility given dislocation yesterday. She has some minimal pain at the medial joint line No significant pain at the lateral joint line No significant tenderness at the tibial plateau, pes anserine bursa, or distal femur Minimal to no tenderness at the patella tendon No tenderness at the quadricep tendon Stable to varus and valgus stress ACL testing does cause some pain, but she has an intact ACL and PCL on drawer testing as well as Lachman  Strength is 5/5 Neurovascularly intact  Laboratory and Imaging Data:  Assessment and Plan:     ICD-10-CM   1. Patellar dislocation, left, initial encounter  S83.005A DG Knee 4 Views W/Patella Left    Ambulatory referral to Physical Therapy    2. Acute pain of left knee  M25.562 DG Knee 4 Views W/Patella Left    Ambulatory referral to Physical Therapy    3. Recurrent dislocation of patella, left  M22.02     4. Hypermobility syndrome  M35.7      Total  encounter time: 30 minutes. This includes total time spent on the day of encounter.  Extensive discussion.  Chronic, repeated patellar dislocations with acute patellar dislocation yesterday.  She does have a Beighton score of 5/9, and she does have hypermobility syndrome, which likely predisposes her to dislocation.  Radiographically, she has a very significant lateral deviation on the sunrise view on her x-rays.  She has partial subluxation laterally right now.  Clinically, she does have a located patella and patella is in the groove with extension and during exam.  I called radiology to review her x-rays, and we  both agreed that this can occur with complete rupture of the medial patellar stabilizing ligaments.  There are no fractures on x-ray.  I think she is in a tough situation.  I think that she will likely continue to repeatedly dislocate her patella.  She was somewhat reluctant to consider surgery.  I think it is reasonable to do an additional test of conservative care to see if she has some improvement.  We are going to start her on physical therapy and I tried to put her in a patellar J brace.  We were unable to fit 1 for her.  I have asked the [facility] to try to fit or custom make a patellar stability brace for the patient.  She is also going to work on her weight. She will follow-up with me in 6 to 8 weeks. If she does have an additional patellar dislocation shortly, I would recommend surgical consultation.  Orders placed today for conditions managed today: Orders Placed This Encounter  Procedures   DG Knee 4 Views W/Patella Left   Ambulatory referral to Physical Therapy    Disposition: 6 to 8 weeks  Dragon [employer] One speech-to-text software was used for transcription in this dictation.  Possible transcriptional errors can occur using Animal nutritionist.   Signed,  Elpidio Galea. Kartel Wolbert, MD   Outpatient Encounter Medications as of 07/10/2023  Medication Sig   albuterol (PROVENTIL) (2.5 MG/3ML) 0.083% nebulizer solution Take 3 mLs (2.5 mg total) by nebulization every 6 (six) hours as needed for wheezing or shortness of breath.   albuterol (VENTOLIN HFA) 108 (90 Base) MCG/ACT inhaler INHALE 2 PUFFS INTO THE LUNGS EVERY 6 HOURS AS NEEDED FOR WHEEZING OR SHORTNESS OF BREATH   cetirizine (ZYRTEC) 10 MG tablet Take 10 mg by mouth daily.   clindamycin (CLEOCIN-T) 1 % lotion Apply topically daily. Apply to affected areas  daily   colchicine 0.6 MG tablet Take 0.6 mg by mouth 2 (two) times daily.    hydrOXYzine (VISTARIL) 25 MG capsule Take 1 capsule (25 mg total) by mouth every 8 (eight)  hours as needed.   JUNEL FE 1/20 1-20 MG-MCG tablet Take 1 tablet by mouth daily.   montelukast (SINGULAIR) 10 MG tablet Take 1 tablet (10 mg total) by mouth at bedtime.   mupirocin ointment (BACTROBAN) 2 % Apply 1 Application topically 2 (two) times daily. As needed for flares   PULMICORT FLEXHALER 90 MCG/ACT inhaler Inhale 1 puff into the lungs 2 (two) times daily.   [DISCONTINUED] benzonatate (TESSALON) 100 MG capsule Take 1 capsule (100 mg total) by mouth 3 (three) times daily as needed for cough.   No facility-administered encounter medications on file as of 07/10/2023.

## 2023-07-11 ENCOUNTER — Encounter: Payer: Self-pay | Admitting: Family Medicine

## 2023-07-11 DIAGNOSIS — M357 Hypermobility syndrome: Secondary | ICD-10-CM | POA: Insufficient documentation

## 2023-08-29 ENCOUNTER — Telehealth: Admitting: Physician Assistant

## 2023-08-29 DIAGNOSIS — B9689 Other specified bacterial agents as the cause of diseases classified elsewhere: Secondary | ICD-10-CM

## 2023-08-29 DIAGNOSIS — J069 Acute upper respiratory infection, unspecified: Secondary | ICD-10-CM | POA: Diagnosis not present

## 2023-08-29 MED ORDER — DOXYCYCLINE HYCLATE 100 MG PO TABS: 100.0000 mg | ORAL_TABLET | Freq: Two times a day (BID) | ORAL | 0 refills | Status: AC

## 2023-08-29 MED ORDER — PROMETHAZINE-DM 6.25-15 MG/5ML PO SYRP: 5.0000 mL | ORAL_SOLUTION | Freq: Four times a day (QID) | ORAL | 0 refills | Status: DC | PRN

## 2023-08-29 MED ORDER — PREDNISONE 10 MG (21) PO TBPK: ORAL_TABLET | ORAL | 0 refills | Status: AC

## 2023-08-29 NOTE — Progress Notes (Signed)
 Virtual Visit Consent   Molly Molina, you are scheduled for a virtual visit with a [facility] provider today. Just as with appointments in the office, your consent must be obtained to participate. Your consent will be active for this visit and any virtual visit you may have with one of our providers in the next 365 days. If you have a MyChart account, a copy of this consent can be sent to you electronically.  As this is a virtual visit, video technology does not allow for your provider to perform a traditional examination. This may limit your provider's ability to fully assess your condition. If your provider identifies any concerns that need to be evaluated in person or the need to arrange testing (such as labs, EKG, etc.), we will make arrangements to do so. Although advances in technology are sophisticated, we cannot ensure that it will always work on either your end or our end. If the connection with a video visit is poor, the visit may have to be switched to a telephone visit. With either a video or telephone visit, we are not always able to ensure that we have a secure connection.  By engaging in this virtual visit, you consent to the provision of healthcare and authorize for your insurance to be billed (if applicable) for the services provided during this visit. Depending on your insurance coverage, you may receive a charge related to this service.  I need to obtain your verbal consent now. Are you willing to proceed with your visit today? Molly Molina has provided verbal consent on 08/29/2023 for a virtual visit (video or telephone). Molly Molina, [place]  Date: 08/29/2023 6:53 PM   Virtual Visit via Video Note   I, Molly Molina, connected with  Molly Molina  ([id number], 04-27-2000) on 08/29/23 at  6:45 PM EDT by a video-enabled telemedicine application and verified that I am speaking with the correct person using two identifiers.  Location: Patient: Virtual Visit Location  Patient: Home Provider: Virtual Visit Location Provider: Home Office   I discussed the limitations of evaluation and management by telemedicine and the availability of in person appointments. The patient expressed understanding and agreed to proceed.    History of Present Illness: Molly Molina is a 23 y.o. who identifies as a female who was assigned female at birth, and is being seen today for URI symptoms starting last Friday. Patient endorses symptom onset with sore throat, odynophagia, anorexia, nasal congestion and post-nasal drip. Then proceeded to note increased sinus congestion, chest congestion and cough. Cough is productive of thick, colored phlegm. Now with some L ear pressure/pain and decreased hearing.  Notes intermittent fever since onset of symptoms.   HPI: HPI  Problems:  Patient Active Problem List   Diagnosis Date Noted   Hypermobility syndrome 07/11/2023   Dyspepsia 07/14/2017   Subluxation of left patella 08/08/2016   Left knee pain 06/21/2016   Raynaud's phenomenon    Eczema    Dysmenorrhea in adolescent    Asthma    Amplified musculoskeletal pain    Severe anxiety with panic 01/14/2016   Irregular menses 04/15/2015   Recurrent mouth ulceration 12/15/2014   Arthralgia 12/10/2014   Behcet's syndrome (HCC) 04/04/2014   History of proteinuria syndrome 04/04/2010    Allergies:  Allergies  Allergen Reactions   Mushroom Extract Complex (Obsolete) Hives and Rash   Cephalosporins Hives and Rash   Medications:  Current Outpatient Medications:    doxycycline  (VIBRA -TABS) 100 MG tablet, Take 1 tablet (100  mg total) by mouth 2 (two) times daily., Disp: 14 tablet, Rfl: 0   predniSONE  (STERAPRED UNI-PAK 21 TAB) 10 MG (21) TBPK tablet, Take following package directions, Disp: 21 tablet, Rfl: 0   promethazine-dextromethorphan (PROMETHAZINE-DM) 6.25-15 MG/5ML syrup, Take 5 mLs by mouth 4 (four) times daily as needed for cough., Disp: 118 mL, Rfl: 0   albuterol  (PROVENTIL )  (2.5 MG/3ML) 0.083% nebulizer solution, Take 3 mLs (2.5 mg total) by nebulization every 6 (six) hours as needed for wheezing or shortness of breath., Disp: 150 mL, Rfl: 2   albuterol  (VENTOLIN  HFA) 108 (90 Base) MCG/ACT inhaler, INHALE 2 PUFFS INTO THE LUNGS EVERY 6 HOURS AS NEEDED FOR WHEEZING OR SHORTNESS OF BREATH, Disp: 6.7 g, Rfl: 0   cetirizine (ZYRTEC) 10 MG tablet, Take 10 mg by mouth daily., Disp: , Rfl:    clindamycin  (CLEOCIN -T) 1 % lotion, Apply topically daily. Apply to affected areas  daily, Disp: 60 mL, Rfl: 3   colchicine 0.6 MG tablet, Take 0.6 mg by mouth 2 (two) times daily. , Disp: , Rfl:    hydrOXYzine  (VISTARIL ) 25 MG capsule, Take 1 capsule (25 mg total) by mouth every 8 (eight) hours as needed., Disp: 30 capsule, Rfl: 0   JUNEL FE 1/20 1-20 MG-MCG tablet, Take 1 tablet by mouth daily., Disp: , Rfl:    montelukast  (SINGULAIR ) 10 MG tablet, Take 1 tablet (10 mg total) by mouth at bedtime., Disp: 30 tablet, Rfl: 5   PULMICORT  FLEXHALER 90 MCG/ACT inhaler, Inhale 1 puff into the lungs 2 (two) times daily., Disp: 1 each, Rfl: 0  Observations/Objective: Patient is well-developed, well-nourished in no acute distress.  Resting comfortably at home.  Head is normocephalic, atraumatic.  No labored breathing. Speech is clear and coherent with logical content.  Patient is alert and oriented at baseline.   Assessment and Plan: 1. Bacterial URI (Primary) - promethazine-dextromethorphan (PROMETHAZINE-DM) 6.25-15 MG/5ML syrup; Take 5 mLs by mouth 4 (four) times daily as needed for cough.  Dispense: 118 mL; Refill: 0 - predniSONE  (STERAPRED UNI-PAK 21 TAB) 10 MG (21) TBPK tablet; Take following package directions  Dispense: 21 tablet; Refill: 0 - doxycycline  (VIBRA -TABS) 100 MG tablet; Take 1 tablet (100 mg total) by mouth 2 (two) times daily.  Dispense: 14 tablet; Refill: 0  Rx Doxycycline .  Increase fluids.  Rest.  Saline nasal spray.  Probiotic.  Mucinex as directed.  Humidifier in  bedroom. Prednisone  and promethazine-dm per orders.  Call or return to clinic if symptoms are not improving.   Follow Up Instructions: I discussed the assessment and treatment plan with the patient. The patient was provided an opportunity to ask questions and all were answered. The patient agreed with the plan and demonstrated an understanding of the instructions.  A copy of instructions were sent to the patient via MyChart unless otherwise noted below.   The patient was advised to call back or seek an in-person evaluation if the symptoms worsen or if the condition fails to improve as anticipated.    Molly Maillard, PA-C

## 2023-08-29 NOTE — Patient Instructions (Signed)
 Satonya Stopper, thank you for joining Hyla Maillard, PA-C for today's virtual visit.  While this provider is not your primary care provider (PCP), if your PCP is located in our provider database this encounter information will be shared with them immediately following your visit.   A [facility] MyChart account gives you access to today's visit and all your visits, tests, and labs performed at [facility] " click here if you don't have a [facility] MyChart account or go to [email or web address]  Consent: (Patient) Molly Molina provided verbal consent for this virtual visit at the beginning of the encounter.  Current Medications:  Current Outpatient Medications:    albuterol  (PROVENTIL ) (2.5 MG/3ML) 0.083% nebulizer solution, Take 3 mLs (2.5 mg total) by nebulization every 6 (six) hours as needed for wheezing or shortness of breath., Disp: 150 mL, Rfl: 2   albuterol  (VENTOLIN  HFA) 108 (90 Base) MCG/ACT inhaler, INHALE 2 PUFFS INTO THE LUNGS EVERY 6 HOURS AS NEEDED FOR WHEEZING OR SHORTNESS OF BREATH, Disp: 6.7 g, Rfl: 0   cetirizine (ZYRTEC) 10 MG tablet, Take 10 mg by mouth daily., Disp: , Rfl:    clindamycin  (CLEOCIN -T) 1 % lotion, Apply topically daily. Apply to affected areas  daily, Disp: 60 mL, Rfl: 3   colchicine 0.6 MG tablet, Take 0.6 mg by mouth 2 (two) times daily. , Disp: , Rfl:    hydrOXYzine  (VISTARIL ) 25 MG capsule, Take 1 capsule (25 mg total) by mouth every 8 (eight) hours as needed., Disp: 30 capsule, Rfl: 0   JUNEL FE 1/20 1-20 MG-MCG tablet, Take 1 tablet by mouth daily., Disp: , Rfl:    montelukast  (SINGULAIR ) 10 MG tablet, Take 1 tablet (10 mg total) by mouth at bedtime., Disp: 30 tablet, Rfl: 5   mupirocin  ointment (BACTROBAN ) 2 %, Apply 1 Application topically 2 (two) times daily. As needed for flares, Disp: 22 g, Rfl: 3   PULMICORT  FLEXHALER 90 MCG/ACT inhaler, Inhale 1 puff into the lungs 2 (two) times daily., Disp: 1 each, Rfl: 0   Medications  ordered in this encounter:  No orders of the defined types were placed in this encounter.    *If you need refills on other medications prior to your next appointment, please contact your pharmacy*  Follow-Up: Call back or seek an in-person evaluation if the symptoms worsen or if the condition fails to improve as anticipated.  [facility] [phone]  Other Instructions Take antibiotic (Doxycycline ) as directed.  Increase fluids.  Get plenty of rest. Use Mucinex for congestion. Take the prednisone  and cough medications as directed. Take a daily probiotic (I recommend Align or Culturelle, but even Activia Yogurt may be beneficial).  A humidifier placed in the bedroom may offer some relief for a dry, scratchy throat of nasal irritation.  Read information below on acute bronchitis. Please call or return to clinic if symptoms are not improving.  Acute Bronchitis Bronchitis is when the airways that extend from the windpipe into the lungs get red, puffy, and painful (inflamed). Bronchitis often causes thick spit (mucus) to develop. This leads to a cough. A cough is the most common symptom of bronchitis. In acute bronchitis, the condition usually begins suddenly and goes away over time (usually in 2 weeks). Smoking, allergies, and asthma can make bronchitis worse. Repeated episodes of bronchitis may cause more lung problems.  HOME CARE Rest. Drink enough fluids to keep your pee (urine) clear or pale yellow (unless you need to limit fluids as told by  your doctor). Only take over-the-counter or prescription medicines as told by your doctor. Avoid smoking and secondhand smoke. These can make bronchitis worse. If you are a smoker, think about using nicotine gum or skin patches. Quitting smoking will help your lungs heal faster. Reduce the chance of getting bronchitis again by: Washing your hands often. Avoiding people with cold symptoms. Trying not to touch your hands to your mouth,  nose, or eyes. Follow up with your doctor as told.  GET HELP IF: Your symptoms do not improve after 1 week of treatment. Symptoms include: Cough. Fever. Coughing up thick spit. Body aches. Chest congestion. Chills. Shortness of breath. Sore throat.  GET HELP RIGHT AWAY IF:  You have an increased fever. You have chills. You have severe shortness of breath. You have bloody thick spit (sputum). You throw up (vomit) often. You lose too much body fluid (dehydration). You have a severe headache. You faint.  MAKE SURE YOU:  Understand these instructions. Will watch your condition. Will get help right away if you are not doing well or get worse. Document Released: 09/07/2007 Document Revised: 11/21/2012 Document Reviewed: 09/11/2012 [facility] Patient Information 2015 [place], [place]. This information is not intended to replace advice given to you by your health care provider. Make sure you discuss any questions you have with your health care provider.    If you have been instructed to have an in-person evaluation today at a local Urgent Care facility, please use the link below. It will take you to a list of all of our available [facility], including address, phone number and hours of operation. Please do not delay care.  [facility]  If you or a family member do not have a primary care provider, use the link below to schedule a visit and establish care. When you choose a [facility] primary care physician or advanced practice provider, you gain a long-term partner in health. Find a Primary Care Provider  Learn more about [facility] in-office and virtual care options:  - Get Care Now

## 2023-09-26 ENCOUNTER — Ambulatory Visit: Payer: Managed Care, Other (non HMO) | Admitting: Dermatology

## 2023-10-02 ENCOUNTER — Telehealth: Admitting: Family Medicine

## 2023-10-02 DIAGNOSIS — R197 Diarrhea, unspecified: Secondary | ICD-10-CM

## 2023-10-02 DIAGNOSIS — J029 Acute pharyngitis, unspecified: Secondary | ICD-10-CM

## 2023-10-02 NOTE — Progress Notes (Signed)
 [place]  Recent treatment for bacterial infection of URI but our group in last month Reports today having left sided soreness of tonsils and congestion and ear pain. Needs swabbing for covid and flu, as she reports diarrhea as well.  In person is recommended  Patient acknowledged agreement and understanding of the plan.

## 2023-11-10 ENCOUNTER — Ambulatory Visit: Payer: Self-pay | Admitting: *Deleted

## 2023-11-10 NOTE — Telephone Encounter (Signed)
 Copied from CRM #[id number]. Topic: Clinical - Red Word Triage >> Nov 10, 2023  1:46 PM Franky GRADE wrote: Red Word that prompted transfer to Nurse Triage: Patient is experiencing severe back pain and had difficulty breathing last night. Reason for Disposition  [1] SEVERE back pain (e.g., excruciating, unable to do any normal activities) AND [2] not improved 2 hours after pain medicine  Answer Assessment - Initial Assessment Questions 1. ONSET: When did the pain begin? (e.g., minutes, hours, days)     Works at [employer].   I had to carry a big dog 124 lbs and he was tugging on me real hard.   Last night I woke up and the sides of my back had sharp pain.   I thought it was my breathing.   I was hyperventilating.  It was painful laying down.   My father listened to my lungs and they were clear.  I took something for gas and it didn't help.   I could not talk or walk.   Both sides of my back are hurting so bad.   It's a sharp pain.   My father could feel spasms in my back.   I put a heating pad on and I took one of my mother's muscle relaxing pills.  It did help after an hour or so then I could fall asleep. I woke up at 11:40 and it's hurting really bad again. 2. LOCATION: Where does it hurt? (upper, mid or lower back)     It's the sides of my back in the middle.   I was short breathing because I could not breath well.    I have a lot of respiratory infections.    The heating pad really helped.    3. SEVERITY: How bad is the pain?  (e.g., Scale 1-10; mild, moderate, or severe)     Severe.   I got nauseas while short breathing last night.    Some of the people at work have covid and colds that I work went.     4. PATTERN: Is the pain constant? (e.g., yes, no; constant, intermittent)      The pain is constant and it's worse if I bend over or laying flat.   5. RADIATION: Does the pain shoot into your legs or somewhere else?     No just on my back 6. CAUSE:  What do you think is causing the back pain?       Maybe a strained back 7. BACK OVERUSE:  Any recent lifting of heavy objects, strenuous work or exercise?     Yes see above 8. MEDICINES: What have you taken so far for the pain? (e.g., nothing, acetaminophen , NSAIDS)     I took a muscle relaxer my mother gave me and I'm using a heating pad.   It did help.   I have Aleve  and ibuprofen .    9. NEUROLOGIC SYMPTOMS: Do you have any weakness, numbness, or problems with bowel/bladder control?     No 10. OTHER SYMPTOMS: Do you have any other symptoms? (e.g., fever, abdomen pain, burning with urination, blood in urine)       No 11. PREGNANCY: Is there any chance you are pregnant? When was your last menstrual period?       Not asked  Protocols used: Back Pain-A-AH FYI Only or Action Required?: FYI only for provider.  Patient was last seen in primary care on 10/02/2023 by Moishe Chiquita HERO, NP.  Called Nurse Triage reporting  Back Pain.Having muscle spasms and pain in middle of her back after a large dog she carried yesterday also tugged real hard.  It's catching her breath due to the pain.  Her mother gave her a muscle relaxer which did help also using the heating pad.  Symptoms began yesterday.  Interventions attempted: Prescription medications: muscle relaxer her mother gave her which did help.  Also using the heating pad.   Encouraged her to take Aleve  or ibuprofen  to help which she was agreeable to doing.  Symptoms are: rapidly worsening. The pain is making it hard for her to catch her breath.  Triage Disposition: See HCP Within 4 Hours (Or PCP Triage) No appts today or until next Wed. So referred to the urgent care.   Pt agreeable to going there.    Patient/caregiver understands and will follow disposition?: Yes

## 2023-11-10 NOTE — Telephone Encounter (Signed)
 Noted

## 2023-12-24 ENCOUNTER — Encounter: Payer: Self-pay | Admitting: Physician Assistant

## 2023-12-25 MED ORDER — HYDROXYZINE PAMOATE 25 MG PO CAPS: 25.0000 mg | ORAL_CAPSULE | Freq: Three times a day (TID) | ORAL | 0 refills | Status: AC | PRN

## 2024-02-15 ENCOUNTER — Ambulatory Visit: Payer: Self-pay

## 2024-02-15 NOTE — Telephone Encounter (Signed)
 FYI Only or Action Required?: FYI only for provider: appointment scheduled on 02/16/24.  Patient was last seen in primary care on 10/02/2023 by Moishe Chiquita HERO, NP.  Called Nurse Triage reporting Nausea.  Symptoms began several weeks ago.  Interventions attempted: OTC medications: dramamine and Rest, hydration, or home remedies.  Symptoms are: unchanged.  Triage Disposition: See PCP When Office is Open (Within 3 Days)  Patient/caregiver understands and will follow disposition?: Yes   Copied from CRM #[id number]. Topic: Clinical - Red Word Triage >> Feb 15, 2024  1:47 PM Rea ORN wrote: Red Word that prompted transfer to Nurse Triage: Nausea for the past 2-3 weeks Reason for Disposition  Nausea lasts > 1 week  Answer Assessment - Initial Assessment Questions 1. NAUSEA SEVERITY: How bad is the nausea? (e.g., mild, moderate, severe; dehydration, weight loss)     Causing poor appetite  2. ONSET: When did the nausea begin?     Constant 2-3 weeks 3. VOMITING: Any vomiting? If Yes, ask: How many times today?     no 4. RECURRENT SYMPTOM: Have you had nausea before? If Yes, ask: When was the last time? What happened that time?     Yes, intermittent for one year 5. CAUSE: What do you think is causing the nausea?     unknown 6. PREGNANCY: Is there any chance you are pregnant? (e.g., unprotected intercourse, missed birth control pill, broken condom)     No-not active in a long time  Protocols used: Nausea-A-AH

## 2024-02-16 ENCOUNTER — Encounter: Payer: Self-pay | Admitting: Family

## 2024-02-16 ENCOUNTER — Ambulatory Visit: Admitting: Family

## 2024-02-16 VITALS — BP 118/64 | HR 75 | Temp 97.5°F | Ht 63.0 in | Wt 272.5 lb

## 2024-02-16 DIAGNOSIS — R110 Nausea: Secondary | ICD-10-CM | POA: Diagnosis not present

## 2024-02-16 MED ORDER — ONDANSETRON 4 MG PO TBDP: 4.0000 mg | ORAL_TABLET | ORAL | 0 refills | Status: DC | PRN

## 2024-02-16 NOTE — Progress Notes (Signed)
 Patient ID: Molly Molina, female    DOB: 2000/06/11, 23 y.o.   MRN: [id number]  Chief Complaint  Patient presents with   Nausea    Pt c/o nausea, present for 2 weeks. Pt denies any other sx. Has tried dramamine, which did not help.    Discussed the use of AI scribe software for clinical note transcription with the patient, who gave verbal consent to proceed.  History of Present Illness Molly Molina is a 23 year old female with an autoimmune condition who presents with persistent nausea and difficulty eating.  She experiences daily nausea for the past three weeks, present upon waking and not relieved by eating. She tolerates only protein shakes, which also induce nausea. There is no vomiting, but she experiences gagging and a sensation of regurgitation.  She has gastrointestinal issues, including stomach cramping and intestinal inflammation. Over the past year, she has had bloating and intestinal irritation, with bloating and occasional diarrhea, particularly in the mornings. Bowel movements are less regular due to decreased food intake, with constipation and hard stools, and episodes of diarrhea once or twice in the mornings over the past two weeks.  Her menstrual cycles are irregular with heavy bleeding and severe cramps, managed with birth control. She has used Dramamine for nausea with temporary relief and has previously used Zofran  effectively during chemotherapy.  Her social history includes a high-stress job, upcoming marriage, and family health issues, including her mother's recent heart attack. Stress exacerbates her symptoms, and she has a history of anxiety-related gastrointestinal symptoms. She has a supportive network, including her fianc and family, although many live far away.  Assessment & Plan Nausea with constipation and intermittent diarrhea & constipation Nausea for three weeks, exacerbated by eating, with gagging but no vomiting. Constipation with hard stools and  intermittent diarrhea, particularly in the mornings. Possible contributing factors include increased stomach acid, stress, and dietary habits. Differential includes gastrointestinal inflammation due to autoimmune condition and stress-related gastrointestinal symptoms. No blood in stool. No recent acid reflux symptoms. Stress and anxiety may exacerbate symptoms. - Prescribed ondansetron  4mg  ODT q8h for nausea, to be taken every morning for the next few mornings, with the option to take up to three times a day as needed. - Recommended increasing OTC Pepcid to twice daily for one week to address potential increased stomach acid. - Advised increasing water intake to two liters daily and increasing dietary fiber through dark greens and fruits. - Suggested over-the-counter Benefiber or magnesium  oxide daily for constipation if dietary changes are insufficient. - Recommended considering over-the-counter stool softeners if constipation persists despite increased fiber and water intake. - Advised avoiding fried, heavy, and greasy foods to prevent gallbladder irritation. - Encouraged stress management techniques, including deep breathing exercises and journaling. - Call back if symptoms are persisting   Subjective:    Outpatient Medications Prior to Visit  Medication Sig Dispense Refill   albuterol  (PROVENTIL ) (2.5 MG/3ML) 0.083% nebulizer solution Take 3 mLs (2.5 mg total) by nebulization every 6 (six) hours as needed for wheezing or shortness of breath. 150 mL 2   albuterol  (VENTOLIN  HFA) 108 (90 Base) MCG/ACT inhaler INHALE 2 PUFFS INTO THE LUNGS EVERY 6 HOURS AS NEEDED FOR WHEEZING OR SHORTNESS OF BREATH 6.7 g 0   cetirizine (ZYRTEC) 10 MG tablet Take 10 mg by mouth daily.     clindamycin  (CLEOCIN -T) 1 % lotion Apply topically daily. Apply to affected areas  daily 60 mL 3   colchicine 0.6 MG tablet Take 0.6 mg  by mouth 2 (two) times daily.      hydrOXYzine  (VISTARIL ) 25 MG capsule Take 1 capsule (25 mg  total) by mouth every 8 (eight) hours as needed. 30 capsule 0   JUNEL FE 1/20 1-20 MG-MCG tablet Take 1 tablet by mouth daily.     montelukast  (SINGULAIR ) 10 MG tablet Take 1 tablet (10 mg total) by mouth at bedtime. 30 tablet 5   PULMICORT  FLEXHALER 90 MCG/ACT inhaler Inhale 1 puff into the lungs 2 (two) times daily. 1 each 0   doxycycline  (VIBRA -TABS) 100 MG tablet Take 1 tablet (100 mg total) by mouth 2 (two) times daily. 14 tablet 0   predniSONE  (STERAPRED UNI-PAK 21 TAB) 10 MG (21) TBPK tablet Take following package directions 21 tablet 0   promethazine -dextromethorphan (PROMETHAZINE -DM) 6.25-15 MG/5ML syrup Take 5 mLs by mouth 4 (four) times daily as needed for cough. 118 mL 0   No facility-administered medications prior to visit.   Past Medical History:  Diagnosis Date   Amplified musculoskeletal pain    Anemia    Anxiety    Arthralgia    Fever, arthralgias,rash,abd pain, darrhiea, oral ulcers, and fatigue.   Asthma    Asthma    Autism    Behcet's syndrome (HCC) 2016   Possible   Bipolar 2 disorder (HCC)    per patient diagnosis found out to not be bipolar   Depression    Dysmenorrhea in adolescent    Eczema    History of proteinuria syndrome 2012   + hx of hematuria: nephrol w/u in [place] and [place]  both normal.   History of vitamin D  deficiency    Major depressive disorder, single episode, severe with psychotic features (HCC) 12/22/2015   Migraine with aura    Mouth ulcers    some vaginal also   Patellar subluxation, left, sequela 08/08/2016   Raynaud's phenomenon    Septic arthritis of hip (HCC) 2010   Past Surgical History:  Procedure Laterality Date   APPENDECTOMY  2010   MRI L/S spine  2013   Possible bilateral pars defect at L5 without evidence of spondylolisthesis.  Otherwise normal.   Allergies  Allergen Reactions   Mushroom Extract Complex (Obsolete) Hives and Rash   Cephalosporins Hives and Rash      Objective:    Physical Exam Vitals and  nursing note reviewed.  Constitutional:      Appearance: Normal appearance. She is obese.  Cardiovascular:     Rate and Rhythm: Normal rate and regular rhythm.  Pulmonary:     Effort: Pulmonary effort is normal.     Breath sounds: Normal breath sounds.  Musculoskeletal:        General: Normal range of motion.  Skin:    General: Skin is warm and dry.  Neurological:     Mental Status: She is alert.  Psychiatric:        Mood and Affect: Mood normal.        Behavior: Behavior normal.    BP 118/64 (BP Location: Left Arm, Patient Position: Sitting, Cuff Size: Large)   Pulse 75   Temp (!) 97.5 F (36.4 C) (Temporal)   Ht 5' 3 (1.6 m)   Wt 272 lb 8 oz (123.6 kg)   LMP 02/11/2024 (Exact Date)   SpO2 98%   BMI 48.27 kg/m  Wt Readings from Last 3 Encounters:  02/16/24 272 lb 8 oz (123.6 kg)  07/10/23 268 lb (121.6 kg)  02/17/23 264 lb 2 oz (119.8 kg)  Lucius Krabbe, NP

## 2024-02-21 ENCOUNTER — Other Ambulatory Visit: Payer: Self-pay

## 2024-02-21 ENCOUNTER — Emergency Department (HOSPITAL_BASED_OUTPATIENT_CLINIC_OR_DEPARTMENT_OTHER)

## 2024-02-21 ENCOUNTER — Emergency Department (HOSPITAL_BASED_OUTPATIENT_CLINIC_OR_DEPARTMENT_OTHER)
Admission: EM | Admit: 2024-02-21 | Discharge: 2024-02-21 | Disposition: A | Discharge status: Home / Self Care | Attending: Emergency Medicine | Admitting: Emergency Medicine

## 2024-02-21 ENCOUNTER — Encounter (HOSPITAL_BASED_OUTPATIENT_CLINIC_OR_DEPARTMENT_OTHER): Payer: Self-pay | Admitting: Emergency Medicine

## 2024-02-21 DIAGNOSIS — F840 Autistic disorder: Secondary | ICD-10-CM | POA: Insufficient documentation

## 2024-02-21 DIAGNOSIS — R1084 Generalized abdominal pain: Secondary | ICD-10-CM | POA: Diagnosis not present

## 2024-02-21 DIAGNOSIS — J45909 Unspecified asthma, uncomplicated: Secondary | ICD-10-CM | POA: Diagnosis not present

## 2024-02-21 DIAGNOSIS — R109 Unspecified abdominal pain: Secondary | ICD-10-CM | POA: Diagnosis present

## 2024-02-21 LAB — CBC
HCT: 40.1 % (ref 36.0–46.0)
Hemoglobin: 13.4 g/dL (ref 12.0–15.0)
MCH: 27.2 pg (ref 26.0–34.0)
MCHC: 33.4 g/dL (ref 30.0–36.0)
MCV: 81.3 fL (ref 80.0–100.0)
Platelets: 322 K/uL (ref 150–400)
RBC: 4.93 MIL/uL (ref 3.87–5.11)
RDW: 12.6 % (ref 11.5–15.5)
WBC: 6.8 K/uL (ref 4.0–10.5)
nRBC: 0 % (ref 0.0–0.2)

## 2024-02-21 LAB — URINALYSIS, ROUTINE W REFLEX MICROSCOPIC
Bilirubin Urine: NEGATIVE
Glucose, UA: NEGATIVE mg/dL
Hgb urine dipstick: NEGATIVE
Ketones, ur: NEGATIVE mg/dL
Leukocytes,Ua: NEGATIVE
Nitrite: NEGATIVE
Protein, ur: NEGATIVE mg/dL
Specific Gravity, Urine: 1.015 (ref 1.005–1.030)
pH: 6.5 (ref 5.0–8.0)

## 2024-02-21 LAB — COMPREHENSIVE METABOLIC PANEL WITH GFR
ALT: 15 U/L (ref 0–44)
AST: 17 U/L (ref 15–41)
Albumin: 4.6 g/dL (ref 3.5–5.0)
Alkaline Phosphatase: 52 U/L (ref 38–126)
Anion gap: 11 (ref 5–15)
BUN: 12 mg/dL (ref 6–20)
CO2: 24 mmol/L (ref 22–32)
Calcium: 9.1 mg/dL (ref 8.9–10.3)
Chloride: 103 mmol/L (ref 98–111)
Creatinine, Ser: 0.66 mg/dL (ref 0.44–1.00)
GFR, Estimated: >60 mL/min (ref >60)
Glucose, Bld: 107 mg/dL — ABNORMAL HIGH (ref 70–99)
Potassium: 4.2 mmol/L (ref 3.5–5.1)
Sodium: 138 mmol/L (ref 135–145)
Total Bilirubin: 0.4 mg/dL (ref 0.0–1.2)
Total Protein: 7.8 g/dL (ref 6.5–8.1)

## 2024-02-21 LAB — LIPASE, BLOOD: Lipase: 49 U/L (ref 11–51)

## 2024-02-21 LAB — PREGNANCY, URINE: Preg Test, Ur: NEGATIVE

## 2024-02-21 MED ORDER — ONDANSETRON HCL 4 MG/2ML IJ SOLN
4.0000 mg | Freq: Once | INTRAMUSCULAR | Status: AC
  Administered 2024-02-21: 4 mg via INTRAVENOUS
  Filled 2024-02-21: qty 2

## 2024-02-21 MED ORDER — DICYCLOMINE HCL 20 MG PO TABS: 20.0000 mg | ORAL_TABLET | Freq: Two times a day (BID) | ORAL | 0 refills | Status: AC

## 2024-02-21 MED ORDER — MORPHINE SULFATE (PF) 4 MG/ML IV SOLN
4.0000 mg | Freq: Once | INTRAVENOUS | Status: AC
  Administered 2024-02-21: 4 mg via INTRAVENOUS
  Filled 2024-02-21: qty 1

## 2024-02-21 MED ORDER — SODIUM CHLORIDE 0.9 % IV BOLUS
1000.0000 mL | Freq: Once | INTRAVENOUS | Status: AC
  Administered 2024-02-21: 1000 mL via INTRAVENOUS

## 2024-02-21 MED ORDER — ONDANSETRON 4 MG PO TBDP: 4.0000 mg | ORAL_TABLET | Freq: Three times a day (TID) | ORAL | 0 refills | Status: AC | PRN

## 2024-02-21 MED ORDER — IOHEXOL 300 MG/ML  SOLN
125.0000 mL | Freq: Once | INTRAMUSCULAR | Status: AC | PRN
  Administered 2024-02-21: 125 mL via INTRAVENOUS

## 2024-02-21 NOTE — ED Notes (Signed)
 Patient transported to CT

## 2024-02-21 NOTE — Discharge Instructions (Addendum)
 Your CT scan and blood work was reassuring.  Follow-up with your gastroenterologist primary care doctor.  I have listed a gastroenterology referral above that you can also try to see if they can see you any sooner.  I have sent Bentyl and Zofran  into the pharmacy for you.  You did improve after medicines in the emergency department. Return for any concerning symptoms.  Your CT scan results are listed below.  IMPRESSION:  1. No acute localizing process in the abdomen or pelvis.  2. 4.0 cm right ovarian cyst. No follow-up imaging recommended.

## 2024-02-21 NOTE — ED Triage Notes (Signed)
 Pt with nausea x 4 weeks.  Feels bloated.  Any time she eats it feels worse.  No urinary symptoms. Alternates diarrhea and constipation.   Is concerned about her gallbladder.  Pain to upper abdomen.  LMP 1 week ago

## 2024-02-21 NOTE — ED Provider Notes (Signed)
 [place] [facility] Provider Note   CSN: [id number] Arrival date & time: 02/21/24  1840     Patient presents with: Abdominal Pain   Molly Molina is a 23 y.o. female.   23 year old female presents with her fianc and mom for concern of nausea that has been ongoing for 3 weeks and abdominal pain along with bloating sensation that has been ongoing for the past 2 weeks that is progressively worsening.  No fever.  Has had some diarrhea.  She is concerned about her gallbladder.  No prior history of cholelithiasis.  Last menstrual period was 1 week ago.  Pain is generalized.  No urinary complaints.  No vaginal discharge.  Endorses nausea but no vomiting.  The history is provided by the patient. No language interpreter was used.       Prior to Admission medications   Medication Sig Start Date End Date Taking? Authorizing Provider  albuterol  (PROVENTIL ) (2.5 MG/3ML) 0.083% nebulizer solution Take 3 mLs (2.5 mg total) by nebulization every 6 (six) hours as needed for wheezing or shortness of breath. 02/08/22   Job Lukes, PA  albuterol  (VENTOLIN  HFA) 108 (90 Base) MCG/ACT inhaler INHALE 2 PUFFS INTO THE LUNGS EVERY 6 HOURS AS NEEDED FOR WHEEZING OR SHORTNESS OF BREATH 11/23/22   Job Lukes, PA  cetirizine (ZYRTEC) 10 MG tablet Take 10 mg by mouth daily.    [provider]  clindamycin  (CLEOCIN -T) 1 % lotion Apply topically daily. Apply to affected areas  daily 05/22/23 05/21/24  Alm Delon SAILOR, DO  colchicine 0.6 MG tablet Take 0.6 mg by mouth 2 (two) times daily.  08/11/16   [provider]  doxycycline  (VIBRA -TABS) 100 MG tablet Take 1 tablet (100 mg total) by mouth 2 (two) times daily. 08/29/23   Gladis Elsie BROCKS, PA-C  hydrOXYzine  (VISTARIL ) 25 MG capsule Take 1 capsule (25 mg total) by mouth every 8 (eight) hours as needed. 12/25/23   Job Lukes, PA  JUNEL FE 1/20 1-20 MG-MCG tablet Take 1 tablet by mouth daily. 08/03/22    [provider]  montelukast  (SINGULAIR ) 10 MG tablet Take 1 tablet (10 mg total) by mouth at bedtime. 12/15/22   Job Lukes, PA  ondansetron  (ZOFRAN -ODT) 4 MG disintegrating tablet Take 1 tablet (4 mg total) by mouth every 4 (four) hours as needed for nausea or vomiting. 02/16/24   Lucius Krabbe, NP  predniSONE  (STERAPRED UNI-PAK 21 TAB) 10 MG (21) TBPK tablet Take following package directions 08/29/23   Gladis Elsie BROCKS, PA-C  promethazine -dextromethorphan (PROMETHAZINE -DM) 6.25-15 MG/5ML syrup Take 5 mLs by mouth 4 (four) times daily as needed for cough. 08/29/23   Gladis Elsie BROCKS, PA-C  PULMICORT  FLEXHALER 90 MCG/ACT inhaler Inhale 1 puff into the lungs 2 (two) times daily. 12/28/22   Job Lukes, PA    Allergies: Mushroom extract complex (obsolete) and Cephalosporins    Review of Systems  Constitutional:  Negative for fever.  Gastrointestinal:  Positive for abdominal pain, constipation, diarrhea and nausea. Negative for blood in stool and vomiting.  Genitourinary:  Negative for difficulty urinating, dysuria and vaginal discharge.    Updated Vital Signs BP 124/78 (BP Location: Left Arm)   Pulse 70   Temp 98.7 F (37.1 C)   Resp 16   LMP 02/11/2024 (Exact Date)   SpO2 99%   Physical Exam Vitals and nursing note reviewed.  Constitutional:      General: She is not in acute distress.    Appearance: Normal appearance. She  is not ill-appearing.  HENT:     Head: Normocephalic and atraumatic.     Nose: Nose normal.  Eyes:     Conjunctiva/sclera: Conjunctivae normal.  Cardiovascular:     Rate and Rhythm: Normal rate and regular rhythm.  Pulmonary:     Effort: Pulmonary effort is normal. No respiratory distress.  Abdominal:     General: There is no distension.     Palpations: Abdomen is soft.     Tenderness: There is abdominal tenderness (Generalized). There is no right CVA tenderness, left CVA tenderness, guarding or rebound.  Musculoskeletal:         General: No deformity.  Skin:    Findings: No rash.  Neurological:     Mental Status: She is alert.     (all labs ordered are listed, but only abnormal results are displayed) Labs Reviewed  COMPREHENSIVE METABOLIC PANEL WITH GFR - Abnormal; Notable for the following components:      Result Value   Glucose, Bld 107 (*)    All other components within normal limits  LIPASE, BLOOD  CBC  URINALYSIS, ROUTINE W REFLEX MICROSCOPIC  PREGNANCY, URINE    EKG: None  Radiology: CT ABDOMEN PELVIS W CONTRAST Result Date: 02/21/2024 CLINICAL DATA:  Upper abdominal pain. EXAM: CT ABDOMEN AND PELVIS WITH CONTRAST TECHNIQUE: Multidetector CT imaging of the abdomen and pelvis was performed using the standard protocol following bolus administration of intravenous contrast. RADIATION DOSE REDUCTION: This exam was performed according to the departmental dose-optimization program which includes automated exposure control, adjustment of the mA and/or kV according to patient size and/or use of iterative reconstruction technique. CONTRAST:  OMNIPAQUE  IOHEXOL  300 MG/ML  SOLN COMPARISON:  CT abdomen and pelvis 06/20/2016. FINDINGS: Lower chest: No acute abnormality. Hepatobiliary: No focal liver abnormality is seen. No gallstones, gallbladder wall thickening, or biliary dilatation. Pancreas: Unremarkable. No pancreatic ductal dilatation or surrounding inflammatory changes. Spleen: Normal in size without focal abnormality. Adrenals/Urinary Tract: Adrenal glands are unremarkable. Kidneys are normal, without renal calculi, focal lesion, or hydronephrosis. Bladder is unremarkable. Stomach/Bowel: Stomach is within normal limits. No evidence of bowel wall thickening, distention, or inflammatory changes. The appendix is surgically absent. Vascular/Lymphatic: There is a 4.0 cm cystic structure in the right adnexa likely related to the right ovary. The left ovary and uterus are within normal limits. Reproductive: Uterus  and bilateral adnexa are unremarkable. Other: No abdominal wall hernia or abnormality. No abdominopelvic ascites. Musculoskeletal: No acute or significant osseous findings. IMPRESSION: 1. No acute localizing process in the abdomen or pelvis. 2. 4.0 cm right ovarian cyst. No follow-up imaging recommended. Electronically Signed   By: Greig Pique M.D.   On: 02/21/2024 23:10     Procedures   Medications Ordered in the ED  ondansetron  (ZOFRAN ) injection 4 mg (4 mg Intravenous Given 02/21/24 2224)  morphine  (PF) 4 MG/ML injection 4 mg (4 mg Intravenous Given 02/21/24 2225)  sodium chloride  0.9 % bolus 1,000 mL (1,000 mLs Intravenous New Bag/Given 02/21/24 2224)  iohexol  (OMNIPAQUE ) 300 MG/ML solution 125 mL (125 mLs Intravenous Contrast Given 02/21/24 2246)    Clinical Course as of 02/21/24 2338  Wed Feb 21, 2024  2335 Patient reevaluated after CT.  Results given.  Patient feels reassured.  She also states she has felt better after the medications and fluids than she has in the past month. [AA]    Clinical Course User Index [AA] Hildegard Loge, PA-C  Medical Decision Making Amount and/or Complexity of Data Reviewed Labs: ordered. Radiology: ordered.  Risk Prescription drug management.   Medical Decision Making / ED Course   This patient presents to the ED for concern of abdominal discomfort, bloating, this involves an extensive number of treatment options, and is a complaint that carries with it a high risk of complications and morbidity.  The differential diagnosis includes constipation, appendicitis, cholecystitis, pancreatitis, UTI, pyelonephritis, nephrolithiasis, colitis  MDM: 23 year old female presents with abdominal pain, and bloating sensation for the past 3 weeks with associated nausea but no vomiting.  Decreased p.o. intake. Hemodynamically she is stable. No acute distress on exam.  Will obtain labs, CT scan and provide fluids, and pain  control.  Feels significantly improved. CT without acute concern.  Cyst on ovary but no follow-up imaging recommended per radiologist.  She has a follow-up with her GYN coming up on the 24th of this month.  Recommended that she make her GYN aware of this.  GI referral given. Bentyl  and Zofran  prescribed.   Additional history obtained: -Additional history obtained from fianc and mom at bedside -External records from outside source obtained and reviewed including: Chart review including previous notes, labs, imaging, consultation notes   Lab Tests: -I ordered, reviewed, and interpreted labs.   The pertinent results include:   Labs Reviewed  COMPREHENSIVE METABOLIC PANEL WITH GFR - Abnormal; Notable for the following components:      Result Value   Glucose, Bld 107 (*)    All other components within normal limits  LIPASE, BLOOD  CBC  URINALYSIS, ROUTINE W REFLEX MICROSCOPIC  PREGNANCY, URINE      EKG  EKG Interpretation Date/Time:    Ventricular Rate:    PR Interval:    QRS Duration:    QT Interval:    QTC Calculation:   R Axis:      Text Interpretation:           Imaging Studies ordered: I ordered imaging studies including CT abdomen pelvis with contrast I independently visualized and interpreted imaging. I agree with the radiologist interpretation   Medicines ordered and prescription drug management: Meds ordered this encounter  Medications   ondansetron  (ZOFRAN ) injection 4 mg   morphine  (PF) 4 MG/ML injection 4 mg   sodium chloride  0.9 % bolus 1,000 mL   iohexol  (OMNIPAQUE ) 300 MG/ML solution 125 mL    -I have reviewed the patients home medicines and have made adjustments as needed   Reevaluation: After the interventions noted above, I reevaluated the patient and found that they have :improved  Co morbidities that complicate the patient evaluation  Past Medical History:  Diagnosis Date   Amplified musculoskeletal pain    Anemia    Anxiety     Arthralgia    Fever, arthralgias,rash,abd pain, darrhiea, oral ulcers, and fatigue.   Asthma    Asthma    Autism    Behcet's syndrome (HCC) 2016   Possible   Bipolar 2 disorder (HCC)    per patient diagnosis found out to not be bipolar   Depression    Dysmenorrhea in adolescent    Eczema    History of proteinuria syndrome 2012   + hx of hematuria: nephrol w/u in [place] and [place]  both normal.   History of vitamin D  deficiency    Major depressive disorder, single episode, severe with psychotic features (HCC) 12/22/2015   Migraine with aura    Mouth ulcers    some vaginal also   Patellar  subluxation, left, sequela 08/08/2016   Raynaud's phenomenon    Septic arthritis of hip (HCC) 2010      Dispostion: Discharged in stable condition.  Return precaution discussed.  Patient voices understanding and is in agreement with plan.   Final diagnoses:  Abdominal pain, unspecified abdominal location    ED Discharge Orders          Ordered    dicyclomine  (BENTYL ) 20 MG tablet  2 times daily        02/21/24 2338    ondansetron  (ZOFRAN -ODT) 4 MG disintegrating tablet  Every 8 hours PRN        02/21/24 2338               Hildegard Loge, PA-C 02/21/24 2339    Lenor Hollering, MD 02/24/24 1231

## 2024-04-24 ENCOUNTER — Encounter

## 2024-04-24 ENCOUNTER — Telehealth: Admitting: Physician Assistant

## 2024-04-24 DIAGNOSIS — R079 Chest pain, unspecified: Secondary | ICD-10-CM

## 2024-04-24 DIAGNOSIS — R6889 Other general symptoms and signs: Secondary | ICD-10-CM

## 2024-04-24 DIAGNOSIS — R051 Acute cough: Secondary | ICD-10-CM | POA: Diagnosis not present

## 2024-04-24 DIAGNOSIS — J029 Acute pharyngitis, unspecified: Secondary | ICD-10-CM

## 2024-04-24 MED ORDER — PROMETHAZINE-DM 6.25-15 MG/5ML PO SYRP: 5.0000 mL | ORAL_SOLUTION | Freq: Four times a day (QID) | ORAL | 0 refills | Status: AC | PRN

## 2024-04-24 MED ORDER — OSELTAMIVIR PHOSPHATE 75 MG PO CAPS: 75.0000 mg | ORAL_CAPSULE | Freq: Two times a day (BID) | ORAL | 0 refills | Status: AC

## 2024-04-24 MED ORDER — FLUTICASONE PROPIONATE 50 MCG/ACT NA SUSP: 2.0000 | Freq: Every day | NASAL | 0 refills | Status: AC

## 2024-04-24 NOTE — Patient Instructions (Signed)
 " Ricca Bettcher, thank you for joining Delon [name], PA-C for today's virtual visit.  While this provider is not your primary care provider (PCP), if your PCP is located in our provider database this encounter information will be shared with them immediately following your visit.   A [facility] MyChart account gives you access to today's visit and all your visits, tests, and labs performed at [facility]  click here if you don't have a [facility] MyChart account or go to [email or web address]  Consent: (Patient) Molly Molina provided verbal consent for this virtual visit at the beginning of the encounter.  Current Medications:  Current Outpatient Medications:    fluticasone  (FLONASE ) 50 MCG/ACT nasal spray, Place 2 sprays into both nostrils daily., Disp: 16 g, Rfl: 0   oseltamivir  (TAMIFLU ) 75 MG capsule, Take 1 capsule (75 mg total) by mouth 2 (two) times daily., Disp: 10 capsule, Rfl: 0   promethazine -dextromethorphan (PROMETHAZINE -DM) 6.25-15 MG/5ML syrup, Take 5 mLs by mouth 4 (four) times daily as needed., Disp: 118 mL, Rfl: 0   albuterol  (PROVENTIL ) (2.5 MG/3ML) 0.083% nebulizer solution, Take 3 mLs (2.5 mg total) by nebulization every 6 (six) hours as needed for wheezing or shortness of breath., Disp: 150 mL, Rfl: 2   albuterol  (VENTOLIN  HFA) 108 (90 Base) MCG/ACT inhaler, INHALE 2 PUFFS INTO THE LUNGS EVERY 6 HOURS AS NEEDED FOR WHEEZING OR SHORTNESS OF BREATH, Disp: 6.7 g, Rfl: 0   cetirizine (ZYRTEC) 10 MG tablet, Take 10 mg by mouth daily., Disp: , Rfl:    clindamycin  (CLEOCIN -T) 1 % lotion, Apply topically daily. Apply to affected areas  daily, Disp: 60 mL, Rfl: 3   colchicine 0.6 MG tablet, Take 0.6 mg by mouth 2 (two) times daily. , Disp: , Rfl:    dicyclomine  (BENTYL ) 20 MG tablet, Take 1 tablet (20 mg total) by mouth 2 (two) times daily., Disp: 20 tablet, Rfl: 0   doxycycline  (VIBRA -TABS) 100 MG tablet, Take 1 tablet (100 mg total) by mouth 2 (two)  times daily., Disp: 14 tablet, Rfl: 0   hydrOXYzine  (VISTARIL ) 25 MG capsule, Take 1 capsule (25 mg total) by mouth every 8 (eight) hours as needed., Disp: 30 capsule, Rfl: 0   JUNEL FE 1/20 1-20 MG-MCG tablet, Take 1 tablet by mouth daily., Disp: , Rfl:    montelukast  (SINGULAIR ) 10 MG tablet, Take 1 tablet (10 mg total) by mouth at bedtime., Disp: 30 tablet, Rfl: 5   ondansetron  (ZOFRAN -ODT) 4 MG disintegrating tablet, Take 1 tablet (4 mg total) by mouth every 8 (eight) hours as needed., Disp: 20 tablet, Rfl: 0   predniSONE  (STERAPRED UNI-PAK 21 TAB) 10 MG (21) TBPK tablet, Take following package directions, Disp: 21 tablet, Rfl: 0   PULMICORT  FLEXHALER 90 MCG/ACT inhaler, Inhale 1 puff into the lungs 2 (two) times daily., Disp: 1 each, Rfl: 0   Medications ordered in this encounter:  Meds ordered this encounter  Medications   oseltamivir  (TAMIFLU ) 75 MG capsule    Sig: Take 1 capsule (75 mg total) by mouth 2 (two) times daily.    Dispense:  10 capsule    Refill:  0    Supervising Provider:   LAMPTEY, PHILIP O [[id number]]   fluticasone  (FLONASE ) 50 MCG/ACT nasal spray    Sig: Place 2 sprays into both nostrils daily.    Dispense:  16 g    Refill:  0    Supervising Provider:   BLAISE ALEENE KIDD [[id number]]   promethazine -dextromethorphan (PROMETHAZINE -DM) 6.25-15 MG/5ML syrup  Sig: Take 5 mLs by mouth 4 (four) times daily as needed.    Dispense:  118 mL    Refill:  0    Supervising Provider:   BLAISE ALEENE KIDD [[id number]]     *If you need refills on other medications prior to your next appointment, please contact your pharmacy*  Follow-Up: Call back or seek an in-person evaluation if the symptoms worsen or if the condition fails to improve as anticipated.  [facility] [phone]  Other Instructions Influenza, Adult Influenza is also called the flu. It's an infection that affects your respiratory tract. This includes your nose, throat, windpipe, and lungs. The flu  is contagious. This means it spreads easily from person to person. It causes symptoms that are like a cold. It can also cause a high fever and body aches. What are the causes? The flu is caused by the influenza virus. You can get it by: Breathing in droplets that are in the air after an infected person coughs or sneezes. Touching something that has the virus on it and then touching your mouth, nose, or eyes. What increases the risk? You may be more likely to get the flu if: You don't wash your hands often. You're near a lot of people during cold and flu season. You touch your mouth, eyes, or nose without washing your hands first. You don't get a flu shot each year. You may also be more at risk for the flu and serious problems, such as a lung infection called pneumonia, if: You're older than 65. You're pregnant. Your immune system is weak. Your immune system is your body's defense system. You have a long-term, or chronic, condition, such as: Heart, kidney, or lung disease. Diabetes. A liver disorder. Asthma. You're very overweight. You have anemia. This is when you don't have enough red blood cells in your body. What are the signs or symptoms? Flu symptoms often start all of a sudden. They may last 4-14 days and include: Fever and chills. Headaches, body aches, or muscle aches. Sore throat. Cough. Runny or stuffy nose. Discomfort in your chest. Not wanting to eat as much as normal. Feeling weak or tired. Feeling dizzy. Nausea or vomiting. How is this diagnosed? The flu may be diagnosed based on your symptoms and medical history. You may also have a physical exam. A swab may be taken from your nose or throat and tested for the virus. How is this treated? If the flu is found early, you can be treated with antiviral medicine. This may be given to you by mouth or through an IV. It can help you feel less sick and get better faster. Taking care of yourself at home can also help your  symptoms get better. Your health care provider may tell you to: Take over-the-counter medicines. Drink lots of fluids. The flu often goes away on its own. If you have very bad symptoms or problems caused by the flu, you may need to be treated in a hospital. Follow these instructions at home: Activity Rest as needed. Get lots of sleep. Stay home from work or school as told by your provider. Leave home only to go see your provider. Do not leave home for other reasons until you don't have a fever for 24 hours without taking medicine. Eating and drinking Take an oral rehydration solution (ORS). This is a drink that is sold at pharmacies and stores. Drink enough fluid to keep your pee pale yellow. Try to drink small amounts  of clear fluids. These include water, ice chips, fruit juice mixed with water, and low-calorie sports drinks. Try to eat bland foods that are easy to digest. These include bananas, applesauce, rice, lean meats, toast, and crackers. Avoid drinks that have a lot of sugar or caffeine in them. These include energy drinks, regular sports drinks, and soda. Do not drink alcohol. Do not eat spicy or fatty foods. General instructions     Take your medicines only as told by your provider. Use a cool mist humidifier to add moisture to the air in your home. This can make it easier for you to breathe. You should also clean the humidifier every day. To do so: Empty the water. Pour clean water in. Cover your mouth and nose when you cough or sneeze. Wash your hands with soap and water often and for at least 20 seconds. It's extra important to do so after you cough or sneeze. If you can't use soap and water, use hand sanitizer. How is this prevented?  Get a flu shot every year. Ask your provider when you should get your flu shot. Stay away from people who are sick during fall and winter. Fall and winter are cold and flu season. Contact a health care provider if: You get new  symptoms. You have chest pain. You have watery poop, also called diarrhea. You have a fever. Your cough gets worse. You start to have more mucus. You feel like you may vomit, or you vomit. Get help right away if: You become short of breath or have trouble breathing. Your skin or nails turn blue. You have very bad pain or stiffness in your neck. You get a sudden headache or pain in your face or ear. You vomit each time you eat or drink. These symptoms may be an emergency. Call 911 right away. Do not wait to see if the symptoms will go away. Do not drive yourself to the hospital. This information is not intended to replace advice given to you by your health care provider. Make sure you discuss any questions you have with your health care provider. Document Revised: 12/22/2022 Document Reviewed: 04/28/2022 Elsevier Patient Education  2024 Elsevier Inc.   If you have been instructed to have an in-person evaluation today at a local Urgent Care facility, please use the link below. It will take you to a list of all of our available [facility], including address, phone number and hours of operation. Please do not delay care.  [facility]  If you or a family member do not have a primary care provider, use the link below to schedule a visit and establish care. When you choose a [facility] primary care physician or advanced practice provider, you gain a long-term partner in health. Find a Primary Care Provider  Learn more about [facility]'s in-office and virtual care options: [facility] - Get Care Now "

## 2024-04-24 NOTE — Progress Notes (Signed)
 " Virtual Visit Consent   Molly Molina, you are scheduled for a virtual visit with a [facility] provider today. Just as with appointments in the office, your consent must be obtained to participate. Your consent will be active for this visit and any virtual visit you may have with one of our providers in the next 365 days. If you have a MyChart account, a copy of this consent can be sent to you electronically.  As this is a virtual visit, video technology does not allow for your provider to perform a traditional examination. This may limit your provider's ability to fully assess your condition. If your provider identifies any concerns that need to be evaluated in person or the need to arrange testing (such as labs, EKG, etc.), we will make arrangements to do so. Although advances in technology are sophisticated, we cannot ensure that it will always work on either your end or our end. If the connection with a video visit is poor, the visit may have to be switched to a telephone visit. With either a video or telephone visit, we are not always able to ensure that we have a secure connection.  By engaging in this virtual visit, you consent to the provision of healthcare and authorize for your insurance to be billed (if applicable) for the services provided during this visit. Depending on your insurance coverage, you may receive a charge related to this service.  I need to obtain your verbal consent now. Are you willing to proceed with your visit today? Molly Molina has provided verbal consent on 04/24/2024 for a virtual visit (video or telephone). Molly [name], PA-C  Date: 04/24/2024 3:10 PM   Virtual Visit via Video Note   IDelon CHRISTELLA Molina, connected with  Molly Molina  ([id number][birth year]) on 04/24/24 at  2:45 PM EST by a video-enabled telemedicine application and verified that I am speaking with the correct person using two identifiers.  Location: Patient: Virtual Visit Location  Patient: Home Provider: Virtual Visit Location Provider: Home Office   I discussed the limitations of evaluation and management by telemedicine and the availability of in person appointments. The patient expressed understanding and agreed to proceed.    History of Present Illness: Molly Molina is a 24 y.o. who identifies as a female who was assigned female at birth, and is being seen today for flu-like symptoms.  HPI: URI  This is a new problem. The current episode started yesterday. The problem has been gradually worsening. Maximum temperature: subjective. The fever has been present for Less than 1 day. Associated symptoms include abdominal pain (decreased appetite), congestion, coughing (dry yesterday, wet today), diarrhea (this morning), headaches, rhinorrhea (and post nasal drainage), a sore throat and swollen glands (left tonsillar gland swollen). Pertinent negatives include no ear pain, nausea, plugged ear sensation, sinus pain, vomiting or wheezing. Associated symptoms comments: Body aches. She has tried acetaminophen  and increased fluids (oil of oregano, Vit D) for the symptoms. The treatment provided no relief.   Attended a wedding and had crowded accommodations at the reception with unknown exposures.  Problems:  Patient Active Problem List   Diagnosis Date Noted   Hypermobility syndrome 07/11/2023   Dyspepsia 07/14/2017   Subluxation of left patella 08/08/2016   Left knee pain 06/21/2016   Raynaud's phenomenon    Eczema    Dysmenorrhea in adolescent    Asthma    Amplified musculoskeletal pain    Severe anxiety with panic 01/14/2016   Irregular menses 04/15/2015  Recurrent mouth ulceration 12/15/2014   Arthralgia 12/10/2014   Behcet's syndrome (HCC) 04/04/2014   History of proteinuria syndrome 04/04/2010    Allergies: Allergies[1] Medications: Current Medications[2]  Observations/Objective: Patient is well-developed, well-nourished in no acute distress.  Resting  comfortably at home.  Head is normocephalic, atraumatic.  No labored breathing.  Speech is clear and coherent with logical content.  Patient is alert and oriented at baseline.    Assessment and Plan: 1. Sore throat (Primary) - oseltamivir  (TAMIFLU ) 75 MG capsule; Take 1 capsule (75 mg total) by mouth 2 (two) times daily.  Dispense: 10 capsule; Refill: 0 - fluticasone  (FLONASE ) 50 MCG/ACT nasal spray; Place 2 sprays into both nostrils daily.  Dispense: 16 g; Refill: 0 - promethazine -dextromethorphan (PROMETHAZINE -DM) 6.25-15 MG/5ML syrup; Take 5 mLs by mouth 4 (four) times daily as needed.  Dispense: 118 mL; Refill: 0  2. Acute cough - oseltamivir  (TAMIFLU ) 75 MG capsule; Take 1 capsule (75 mg total) by mouth 2 (two) times daily.  Dispense: 10 capsule; Refill: 0 - fluticasone  (FLONASE ) 50 MCG/ACT nasal spray; Place 2 sprays into both nostrils daily.  Dispense: 16 g; Refill: 0 - promethazine -dextromethorphan (PROMETHAZINE -DM) 6.25-15 MG/5ML syrup; Take 5 mLs by mouth 4 (four) times daily as needed.  Dispense: 118 mL; Refill: 0  3. Flu-like symptoms - oseltamivir  (TAMIFLU ) 75 MG capsule; Take 1 capsule (75 mg total) by mouth 2 (two) times daily.  Dispense: 10 capsule; Refill: 0 - fluticasone  (FLONASE ) 50 MCG/ACT nasal spray; Place 2 sprays into both nostrils daily.  Dispense: 16 g; Refill: 0 - promethazine -dextromethorphan (PROMETHAZINE -DM) 6.25-15 MG/5ML syrup; Take 5 mLs by mouth 4 (four) times daily as needed.  Dispense: 118 mL; Refill: 0  - Suspect influenza due to symptoms and possible positive exposure; advised to take at home Covid + Flu test - Tamiflu  prescribed in case flu positive as symptoms are consistent, have had high community numbers, and narrow time to start treatment; did advise if negative does not need to take or fill - Promethazine  DM for cough - Flonase  for nasal congestion and drainage - Continue OTC medication of choice for symptomatic management - Push fluids -  Rest - Seek in person evaluation if symptoms worsen or fail to improve   Follow Up Instructions: I discussed the assessment and treatment plan with the patient. The patient was provided an opportunity to ask questions and all were answered. The patient agreed with the plan and demonstrated an understanding of the instructions.  A copy of instructions were sent to the patient via MyChart unless otherwise noted below.    The patient was advised to call back or seek an in-person evaluation if the symptoms worsen or if the condition fails to improve as anticipated.    Michal Callicott M Zaniah Titterington, PA-C     [1]  Allergies Allergen Reactions   Mushroom Extract Complex (Obsolete) Hives and Rash   Cephalosporins Hives and Rash  [2]  Current Outpatient Medications:    fluticasone  (FLONASE ) 50 MCG/ACT nasal spray, Place 2 sprays into both nostrils daily., Disp: 16 g, Rfl: 0   oseltamivir  (TAMIFLU ) 75 MG capsule, Take 1 capsule (75 mg total) by mouth 2 (two) times daily., Disp: 10 capsule, Rfl: 0   promethazine -dextromethorphan (PROMETHAZINE -DM) 6.25-15 MG/5ML syrup, Take 5 mLs by mouth 4 (four) times daily as needed., Disp: 118 mL, Rfl: 0   albuterol  (PROVENTIL ) (2.5 MG/3ML) 0.083% nebulizer solution, Take 3 mLs (2.5 mg total) by nebulization every 6 (six) hours as needed for wheezing or shortness of  breath., Disp: 150 mL, Rfl: 2   albuterol  (VENTOLIN  HFA) 108 (90 Base) MCG/ACT inhaler, INHALE 2 PUFFS INTO THE LUNGS EVERY 6 HOURS AS NEEDED FOR WHEEZING OR SHORTNESS OF BREATH, Disp: 6.7 g, Rfl: 0   cetirizine (ZYRTEC) 10 MG tablet, Take 10 mg by mouth daily., Disp: , Rfl:    clindamycin  (CLEOCIN -T) 1 % lotion, Apply topically daily. Apply to affected areas  daily, Disp: 60 mL, Rfl: 3   colchicine 0.6 MG tablet, Take 0.6 mg by mouth 2 (two) times daily. , Disp: , Rfl:    dicyclomine  (BENTYL ) 20 MG tablet, Take 1 tablet (20 mg total) by mouth 2 (two) times daily., Disp: 20 tablet, Rfl: 0   doxycycline   (VIBRA -TABS) 100 MG tablet, Take 1 tablet (100 mg total) by mouth 2 (two) times daily., Disp: 14 tablet, Rfl: 0   hydrOXYzine  (VISTARIL ) 25 MG capsule, Take 1 capsule (25 mg total) by mouth every 8 (eight) hours as needed., Disp: 30 capsule, Rfl: 0   JUNEL FE 1/20 1-20 MG-MCG tablet, Take 1 tablet by mouth daily., Disp: , Rfl:    montelukast  (SINGULAIR ) 10 MG tablet, Take 1 tablet (10 mg total) by mouth at bedtime., Disp: 30 tablet, Rfl: 5   ondansetron  (ZOFRAN -ODT) 4 MG disintegrating tablet, Take 1 tablet (4 mg total) by mouth every 8 (eight) hours as needed., Disp: 20 tablet, Rfl: 0   predniSONE  (STERAPRED UNI-PAK 21 TAB) 10 MG (21) TBPK tablet, Take following package directions, Disp: 21 tablet, Rfl: 0   PULMICORT  FLEXHALER 90 MCG/ACT inhaler, Inhale 1 puff into the lungs 2 (two) times daily., Disp: 1 each, Rfl: 0  "

## 2024-04-24 NOTE — Progress Notes (Signed)
" °  Because of mention of constant pain in chest/abdomen along with other symptoms, I feel your condition warrants further evaluation and I recommend that you be seen in a face-to-face visit.   NOTE: There will be NO CHARGE for this E-Visit   If you are having a true medical emergency, please call 911.     For an urgent face to face visit, [facility] has multiple urgent care centers for your convenience.  Click the link below for the full list of locations and hours, walk-in wait times, appointment scheduling options and driving directions:  Urgent Care - [place], [place], [place], [place], [place], [place]  [place]     Your MyChart E-visit questionnaire answers were reviewed by a board certified advanced clinical practitioner to complete your personal care plan based on your specific symptoms.    Thank you for using e-Visits.    "

## 2024-05-02 ENCOUNTER — Encounter: Admitting: Physician Assistant

## 2024-05-08 ENCOUNTER — Encounter: Admitting: Physician Assistant
